# Patient Record
Sex: Female | Born: 1975 | Hispanic: Yes | Marital: Single | Smoking: Former smoker
Health system: Southern US, Community
[De-identification: ages and names within clinical notes are randomized; demographics above are authoritative.]

## PROBLEM LIST (undated history)

## (undated) DIAGNOSIS — F419 Anxiety disorder, unspecified: Secondary | ICD-10-CM

## (undated) DIAGNOSIS — F32A Depression, unspecified: Secondary | ICD-10-CM

## (undated) DIAGNOSIS — N939 Abnormal uterine and vaginal bleeding, unspecified: Secondary | ICD-10-CM

## (undated) DIAGNOSIS — D500 Iron deficiency anemia secondary to blood loss (chronic): Secondary | ICD-10-CM

## (undated) DIAGNOSIS — Z789 Other specified health status: Secondary | ICD-10-CM

## (undated) DIAGNOSIS — B1920 Unspecified viral hepatitis C without hepatic coma: Secondary | ICD-10-CM

## (undated) DIAGNOSIS — N83201 Unspecified ovarian cyst, right side: Secondary | ICD-10-CM

## (undated) DIAGNOSIS — D649 Anemia, unspecified: Secondary | ICD-10-CM

## (undated) DIAGNOSIS — K760 Fatty (change of) liver, not elsewhere classified: Secondary | ICD-10-CM

## (undated) HISTORY — PX: CHOLECYSTECTOMY, LAPAROSCOPIC: SHX56

## (undated) HISTORY — DX: Anemia, unspecified: D64.9

## (undated) HISTORY — DX: Anxiety disorder, unspecified: F41.9

## (undated) HISTORY — PX: LAPAROSCOPIC GASTRIC SLEEVE RESECTION: SHX5895

## (undated) HISTORY — DX: Depression, unspecified: F32.A

## (undated) HISTORY — DX: Unspecified viral hepatitis C without hepatic coma: B19.20

## (undated) HISTORY — PX: LAPAROSCOPIC OVARIAN CYSTECTOMY: SUR786

---

## 1996-12-17 HISTORY — PX: CHOLECYSTECTOMY, LAPAROSCOPIC: SHX56

## 2001-12-17 HISTORY — PX: LAPAROSCOPIC OVARIAN CYSTECTOMY: SUR786

## 2014-12-17 HISTORY — PX: LAPAROSCOPIC GASTRIC SLEEVE RESECTION: SHX5895

## 2016-01-18 ENCOUNTER — Other Ambulatory Visit: Payer: Self-pay | Admitting: Obstetrics and Gynecology

## 2016-03-01 ENCOUNTER — Other Ambulatory Visit (HOSPITAL_COMMUNITY): Payer: Self-pay | Admitting: Obstetrics and Gynecology

## 2016-03-01 ENCOUNTER — Encounter (HOSPITAL_COMMUNITY): Payer: Self-pay | Admitting: Obstetrics and Gynecology

## 2016-03-01 DIAGNOSIS — O09522 Supervision of elderly multigravida, second trimester: Secondary | ICD-10-CM

## 2016-03-01 DIAGNOSIS — Z3689 Encounter for other specified antenatal screening: Secondary | ICD-10-CM

## 2016-03-01 DIAGNOSIS — Z3A23 23 weeks gestation of pregnancy: Secondary | ICD-10-CM

## 2016-03-01 DIAGNOSIS — O283 Abnormal ultrasonic finding on antenatal screening of mother: Secondary | ICD-10-CM

## 2016-03-08 ENCOUNTER — Ambulatory Visit (HOSPITAL_COMMUNITY)
Admission: RE | Admit: 2016-03-08 | Discharge: 2016-03-08 | Disposition: A | Discharge status: Home / Self Care | Payer: Medicaid Other | Source: Ambulatory Visit | Attending: Obstetrics and Gynecology | Admitting: Obstetrics and Gynecology

## 2016-03-08 ENCOUNTER — Encounter (HOSPITAL_COMMUNITY): Payer: Self-pay

## 2016-03-08 DIAGNOSIS — Z3689 Encounter for other specified antenatal screening: Secondary | ICD-10-CM

## 2016-03-08 DIAGNOSIS — O09522 Supervision of elderly multigravida, second trimester: Secondary | ICD-10-CM | POA: Insufficient documentation

## 2016-03-08 DIAGNOSIS — Z3A23 23 weeks gestation of pregnancy: Secondary | ICD-10-CM | POA: Insufficient documentation

## 2016-03-08 DIAGNOSIS — Z315 Encounter for genetic counseling: Secondary | ICD-10-CM | POA: Insufficient documentation

## 2016-03-08 DIAGNOSIS — O283 Abnormal ultrasonic finding on antenatal screening of mother: Secondary | ICD-10-CM

## 2016-03-08 DIAGNOSIS — O09529 Supervision of elderly multigravida, unspecified trimester: Secondary | ICD-10-CM

## 2016-03-08 HISTORY — DX: Other specified health status: Z78.9

## 2016-03-09 ENCOUNTER — Other Ambulatory Visit (HOSPITAL_COMMUNITY): Payer: Self-pay | Admitting: *Deleted

## 2016-03-09 DIAGNOSIS — O09529 Supervision of elderly multigravida, unspecified trimester: Secondary | ICD-10-CM | POA: Insufficient documentation

## 2016-03-09 DIAGNOSIS — O35EXX0 Maternal care for other (suspected) fetal abnormality and damage, fetal genitourinary anomalies, not applicable or unspecified: Secondary | ICD-10-CM

## 2016-03-09 DIAGNOSIS — O358XX0 Maternal care for other (suspected) fetal abnormality and damage, not applicable or unspecified: Secondary | ICD-10-CM

## 2016-03-09 NOTE — Progress Notes (Signed)
Genetic Counseling  High-Risk Gestation Note  Appointment Date:  03/08/2016 Referred By: Hassell Done, MD Date of Birth:  [birth year]   [name] History: [id number] Estimated Date of Delivery: 07/03/16 Estimated Gestational Age: [redacted]w[redacted]d Attending: Particia Nearing, MD   [name] and her daughter were seen for genetic counseling because of a maternal age of 40 y.o. and abnormal ultrasound findings.   In Summary:   Detailed ultrasound performed today: Right fetal kidney was not visualized, left fetal kidney visualized. 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 Ultrasound performed at this time visualized normal left fetal kidney. Right fetal kidney could not be visualized at this time. Complete ultrasound results reported separately.   We discussed the possibility of ultrasound findings fitting with unilateral renal agenesis, which is typically sporadic. However, there are familial cases reported that are consistent with autosomal dominant inheritance. [name] reported no known family history of congenital kidney abnormalities for herself. She did not have family history or medical history information for the father of the [name]. Additionally, prenatal exposures such as warfarin, cocaine, and maternal diabetes have been reported to be associated with renal agenesis. [name] reported no known exposures during [name] that would be  expected to increase the chance for birth defects.  We also discussed that renal agenesis is described as an underlying feature of many single gene conditions and can also be seen with chromosome conditions. Specifically, renal agenesis has been described as a feature of two birth defects associations: VACTERL and MURCS, but no additional features suggestive of these associations were reportedly observed on the patient's previous ultrasound. However, she was counseled that there were no other features reportedly observed by ultrasound that were strongly suggestive of a specific single gene condition or additional features suggestive of a chromosome condition at this time.  We discussed gene, chromosomes, and the chance for nondisjunction associated with age of the ova. She was counseled regarding maternal age and the association with risk for chromosome conditions due to nondisjunction with aging of the ova.   We reviewed chromosomes, nondisjunction, and the associated 1 in 83 risk for fetal aneuploidy related to a maternal age of 40 y.o. at [redacted]w[redacted]d gestation.  She was counseled that the risk for aneuploidy decreases as gestational age increases, accounting for those pregnancies which spontaneously abort.  We specifically discussed Down syndrome (trisomy 43), trisomies 77 and 45, and sex chromosome aneuploidies (47,XXX and 47,XXY) including the common features and prognoses of each.  [name] previously had Quad screening, which was within normal range. We reviewed that this screen modifies the a priori risk to provide a [name] specific risk assessment but is not diagnostic for these conditions. We reviewed the reduction in risks for fetal Down syndrome (1 in 711), open spina bifida (1 in 10,000), and trisomy 18. We reviewed additional available screening and diagnostic option for chromosome conditions.  Regarding screening tests, we discussed the option of noninvasive prenatal screening (NIPS)/cell  free fetal DNA testing.  She understands that screening tests are used to modify a  patient's a priori risk for aneuploidy, typically based on age.  This estimate provides a [name] specific risk assessment. We reviewed the risks, benefits, and limitations of NIPS and reviewed the conditions screened, detection rates, and false positive rates for each. We also reviewed the availability of diagnostic option of amniocentesis. We discussed that associated 1 in 300-500 risk for complications with amniocentesis, including spontaneous [name] loss.  After careful consideration of these options, [name] elected to proceed with NIPS [facility] and declined amniocentesis at this time.  She understands that ultrasound and NIPS cannot diagnose or rule out all birth defects or genetic syndromes.   We discussed that in the case of isolated renal agenesis, recurrence risk is likely low, given that sporadic occurrence is observed in the majority of cases. However, given that autosomal dominant inheritance has been observed in families, renal imaging would be available to [name] in order to more accurately determine recurrence risk assessment for relatives. In the case of an underlying autosomal dominant trait, recurrence risk would be 1 in 2 (50%). In the less likely case of an underlying genetic syndrome as the cause, recurrence risk would depend upon the inheritance of the particular condition.  We discussed the option of a prenatal consultation with pediatric urology, and [name] is interested in this referral. We will facilitate this for the patient. Follow-up ultrasound is scheduled for 04/19/16.   [name]'s family history was reviewed and found to be noncontributory for birth defects, intellectual disability, and known genetic conditions. [name] was not familiar with the father of the baby's family  history.  We, therefore, cannot comment on how his history might contribute to the overall chance for birth defects or genetic conditions.   Without further information regarding the provided family history, an accurate genetic risk cannot be calculated. Further genetic counseling is warranted if more information is obtained.  [name] denied exposure to environmental toxins or chemical agents. She denied the use of alcohol, tobacco or street drugs. She denied significant viral illnesses during the course of her [name]. Her medical and surgical histories were noncontributory.   I counseled Ms. Heidi FusiMaria Lepage regarding the above risks and available options.  The approximate face-to-face time with the genetic counselor was 35 minutes.  Heidi PlowmanKaren Britteney Ayotte, MS,  Certified Genetic Counselor 03/09/2016

## 2016-03-15 ENCOUNTER — Other Ambulatory Visit (HOSPITAL_COMMUNITY): Payer: Self-pay

## 2016-03-15 ENCOUNTER — Telehealth (HOSPITAL_COMMUNITY): Payer: Self-pay | Admitting: MS"

## 2016-03-15 NOTE — Telephone Encounter (Signed)
Called Heidi [name] to discuss her prenatal cell free DNA test results.  [name] [facility].  Testing was offered because of ultrasound finding and maternal age.   The patient was identified by name and DOB.  We reviewed that these are within normal limits, showing a less than 1 in 10,000 risk for trisomies 21, 18 and 13, and monosomy X (Turner syndrome).  In addition, the risk for triploidy and sex chromosome trisomies (47,XXX and 47,XXY) was also low risk.  We reviewed that this testing identifies > 99% of pregnancies with trisomy 1421, trisomy 2813, sex chromosome trisomies (47,XXX and 47,XXY), and triploidy. The detection rate for trisomy 18 is 96%.  The detection rate for monosomy X is ~92%.  The false positive rate is <0.1% for all conditions. Testing was also consistent with female fetal sex.  She understands that this testing does not identify all genetic conditions.  All questions were answered to her satisfaction, she was encouraged to call with additional questions or concerns.  Quinn PlowmanKaren Darivs Lunden, MS Certified Genetic Counselor 03/15/2016 2:28 PM

## 2016-03-15 NOTE — Telephone Encounter (Signed)
Attempted to contact patient regarding prenatal cell free DNA testing results, which are within normal range. Left message for patient to return call.   Clydie BraunKaren Kashmir Lysaght  03/15/2016 1:29 PM

## 2016-04-19 ENCOUNTER — Ambulatory Visit (HOSPITAL_COMMUNITY): Payer: Medicaid Other

## 2016-04-25 ENCOUNTER — Ambulatory Visit (HOSPITAL_COMMUNITY)
Admission: RE | Admit: 2016-04-25 | Discharge: 2016-04-25 | Disposition: A | Discharge status: Home / Self Care | Payer: Medicaid Other | Source: Ambulatory Visit | Attending: Obstetrics and Gynecology | Admitting: Obstetrics and Gynecology

## 2016-04-25 ENCOUNTER — Other Ambulatory Visit (HOSPITAL_COMMUNITY): Payer: Self-pay | Admitting: Maternal and Fetal Medicine

## 2016-04-25 ENCOUNTER — Encounter (HOSPITAL_COMMUNITY): Payer: Self-pay

## 2016-04-25 DIAGNOSIS — Z3A30 30 weeks gestation of pregnancy: Secondary | ICD-10-CM | POA: Diagnosis not present

## 2016-04-25 DIAGNOSIS — O99213 Obesity complicating pregnancy, third trimester: Secondary | ICD-10-CM

## 2016-04-25 DIAGNOSIS — O358XX0 Maternal care for other (suspected) fetal abnormality and damage, not applicable or unspecified: Secondary | ICD-10-CM

## 2016-04-25 DIAGNOSIS — O35EXX0 Maternal care for other (suspected) fetal abnormality and damage, fetal genitourinary anomalies, not applicable or unspecified: Secondary | ICD-10-CM

## 2016-04-25 DIAGNOSIS — O09523 Supervision of elderly multigravida, third trimester: Secondary | ICD-10-CM | POA: Diagnosis not present

## 2016-04-25 DIAGNOSIS — E669 Obesity, unspecified: Secondary | ICD-10-CM | POA: Diagnosis not present

## 2016-04-26 ENCOUNTER — Other Ambulatory Visit (HOSPITAL_COMMUNITY): Payer: Self-pay | Admitting: *Deleted

## 2016-04-26 DIAGNOSIS — O35EXX0 Maternal care for other (suspected) fetal abnormality and damage, fetal genitourinary anomalies, not applicable or unspecified: Secondary | ICD-10-CM

## 2016-04-26 DIAGNOSIS — O358XX0 Maternal care for other (suspected) fetal abnormality and damage, not applicable or unspecified: Secondary | ICD-10-CM

## 2016-05-10 ENCOUNTER — Encounter (HOSPITAL_COMMUNITY): Payer: Self-pay

## 2016-05-10 ENCOUNTER — Other Ambulatory Visit (HOSPITAL_COMMUNITY): Payer: Self-pay | Admitting: Maternal and Fetal Medicine

## 2016-05-10 ENCOUNTER — Ambulatory Visit (HOSPITAL_COMMUNITY)
Admission: RE | Admit: 2016-05-10 | Discharge: 2016-05-10 | Disposition: A | Discharge status: Home / Self Care | Payer: Medicaid Other | Source: Ambulatory Visit | Attending: Obstetrics and Gynecology | Admitting: Obstetrics and Gynecology

## 2016-05-10 DIAGNOSIS — E669 Obesity, unspecified: Secondary | ICD-10-CM | POA: Insufficient documentation

## 2016-05-10 DIAGNOSIS — Z3A32 32 weeks gestation of pregnancy: Secondary | ICD-10-CM | POA: Insufficient documentation

## 2016-05-10 DIAGNOSIS — O35EXX0 Maternal care for other (suspected) fetal abnormality and damage, fetal genitourinary anomalies, not applicable or unspecified: Secondary | ICD-10-CM

## 2016-05-10 DIAGNOSIS — O358XX0 Maternal care for other (suspected) fetal abnormality and damage, not applicable or unspecified: Secondary | ICD-10-CM | POA: Diagnosis present

## 2016-05-10 DIAGNOSIS — O09523 Supervision of elderly multigravida, third trimester: Secondary | ICD-10-CM

## 2016-05-10 DIAGNOSIS — Z36 Encounter for antenatal screening of mother: Secondary | ICD-10-CM | POA: Diagnosis not present

## 2016-05-10 DIAGNOSIS — IMO0002 Reserved for concepts with insufficient information to code with codable children: Secondary | ICD-10-CM

## 2016-05-10 DIAGNOSIS — O359XX0 Maternal care for (suspected) fetal abnormality and damage, unspecified, not applicable or unspecified: Secondary | ICD-10-CM

## 2016-05-10 DIAGNOSIS — Z0489 Encounter for examination and observation for other specified reasons: Secondary | ICD-10-CM

## 2016-05-10 DIAGNOSIS — O99213 Obesity complicating pregnancy, third trimester: Secondary | ICD-10-CM | POA: Insufficient documentation

## 2016-05-15 ENCOUNTER — Ambulatory Visit (HOSPITAL_COMMUNITY): Payer: Medicaid Other

## 2016-05-16 ENCOUNTER — Ambulatory Visit (HOSPITAL_COMMUNITY): Payer: Medicaid Other

## 2016-06-08 ENCOUNTER — Encounter (HOSPITAL_COMMUNITY): Payer: Self-pay

## 2016-06-08 ENCOUNTER — Ambulatory Visit (HOSPITAL_COMMUNITY)
Admission: RE | Admit: 2016-06-08 | Discharge: 2016-06-08 | Disposition: A | Discharge status: Home / Self Care | Payer: Medicaid Other | Source: Ambulatory Visit | Attending: Obstetrics and Gynecology | Admitting: Obstetrics and Gynecology

## 2016-06-08 ENCOUNTER — Other Ambulatory Visit (HOSPITAL_COMMUNITY): Payer: Self-pay | Admitting: Maternal and Fetal Medicine

## 2016-06-08 VITALS — BP 100/72 | HR 65 | Wt 178.2 lb

## 2016-06-08 DIAGNOSIS — E669 Obesity, unspecified: Secondary | ICD-10-CM | POA: Insufficient documentation

## 2016-06-08 DIAGNOSIS — O99213 Obesity complicating pregnancy, third trimester: Secondary | ICD-10-CM | POA: Insufficient documentation

## 2016-06-08 DIAGNOSIS — Z3A36 36 weeks gestation of pregnancy: Secondary | ICD-10-CM

## 2016-06-08 DIAGNOSIS — O09529 Supervision of elderly multigravida, unspecified trimester: Secondary | ICD-10-CM

## 2016-06-08 DIAGNOSIS — Q639 Congenital malformation of kidney, unspecified: Secondary | ICD-10-CM | POA: Insufficient documentation

## 2016-06-08 DIAGNOSIS — O09523 Supervision of elderly multigravida, third trimester: Secondary | ICD-10-CM | POA: Diagnosis present

## 2016-06-08 DIAGNOSIS — O358XX0 Maternal care for other (suspected) fetal abnormality and damage, not applicable or unspecified: Secondary | ICD-10-CM | POA: Insufficient documentation

## 2016-06-08 DIAGNOSIS — O35EXX0 Maternal care for other (suspected) fetal abnormality and damage, fetal genitourinary anomalies, not applicable or unspecified: Secondary | ICD-10-CM

## 2017-01-11 ENCOUNTER — Encounter (HOSPITAL_COMMUNITY): Payer: Self-pay

## 2017-10-02 IMAGING — US US MFM FETAL BPP W/O NON-STRESS
1 series · 13 of 28 positions shown · non-contrast
Comparison: none

[Series 1: us mfm fetal bpp w/o non-stress · 50 acquisitions, 13 frames shown]
[im 2/50]
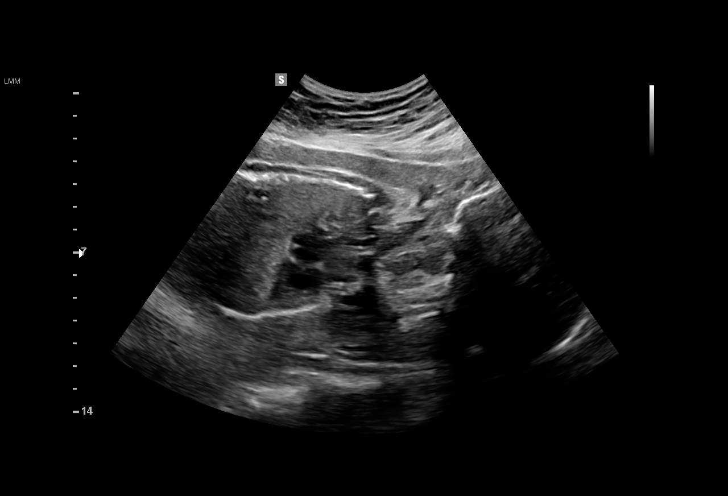
[im 6/50]
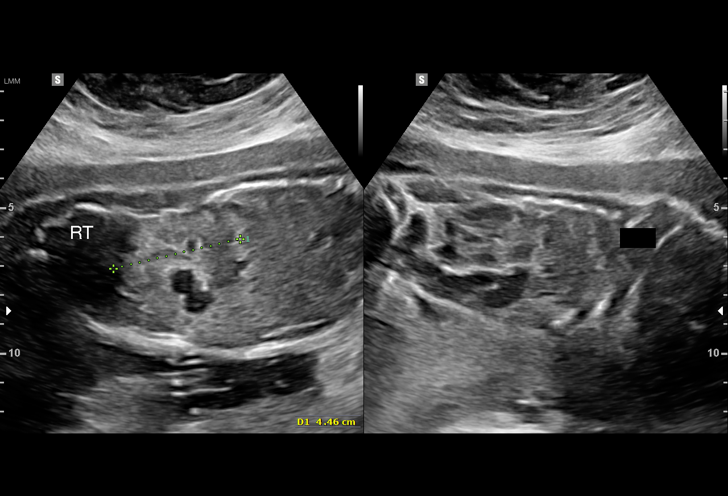
[im 10/50]
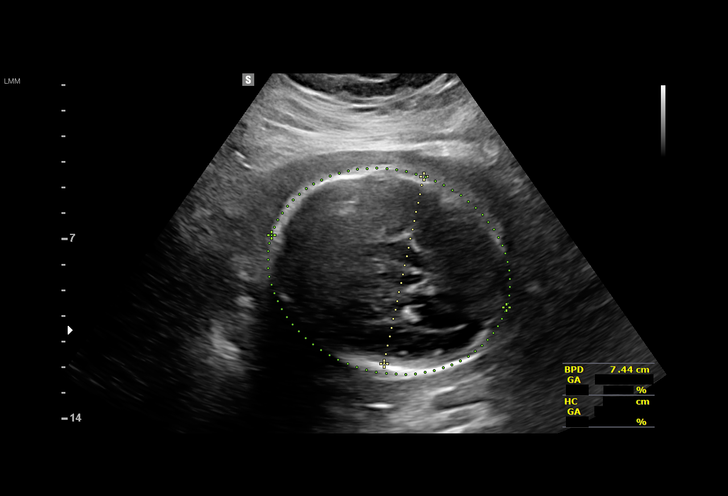
[im 13/50]
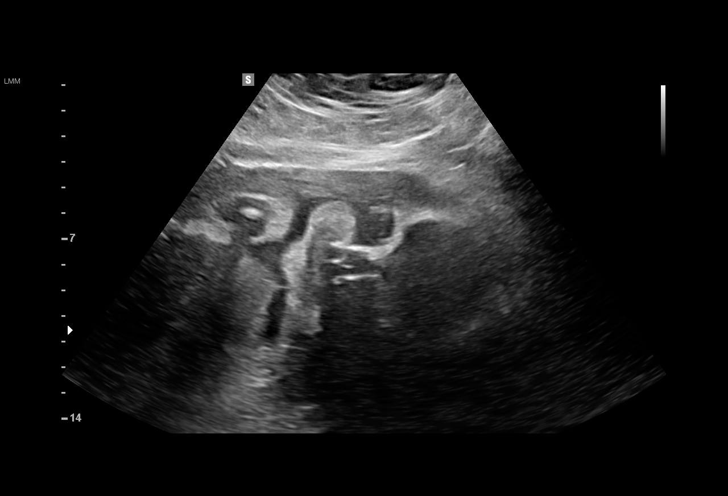
[im 17/50]
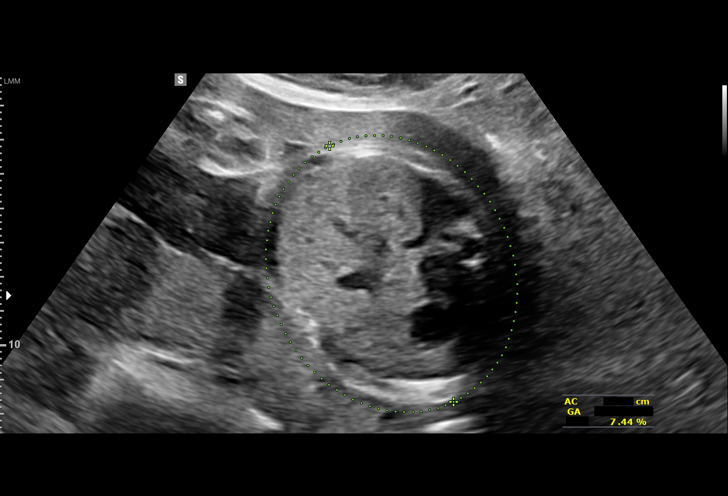
[im 20/50]
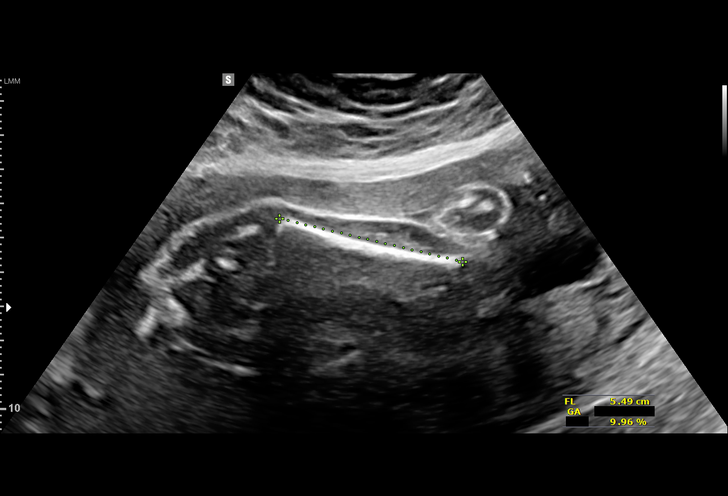
[im 26/50]
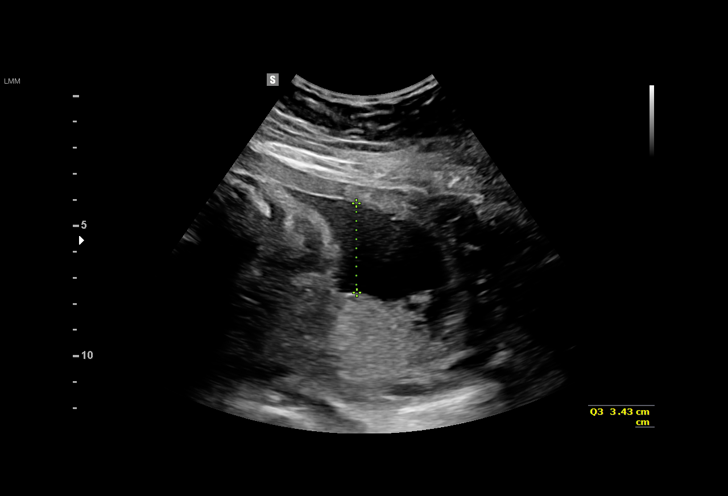
[im 30/50]
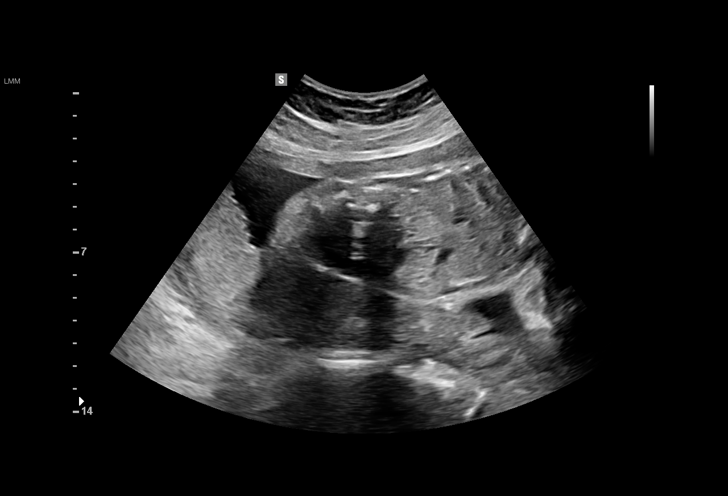
[im 33/50]
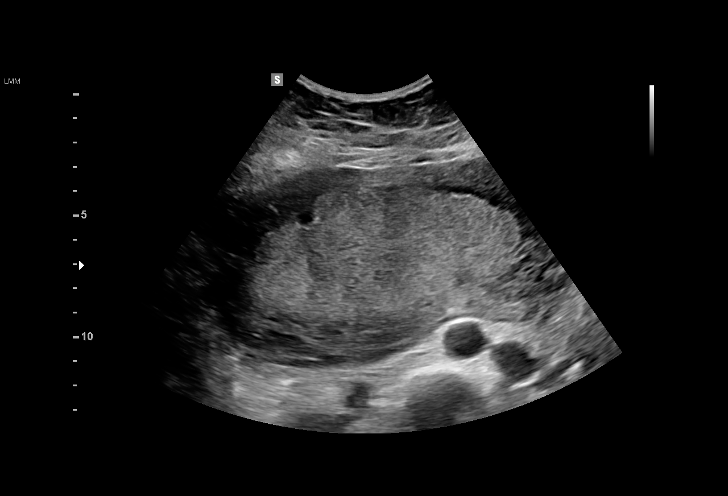
[im 37/50]
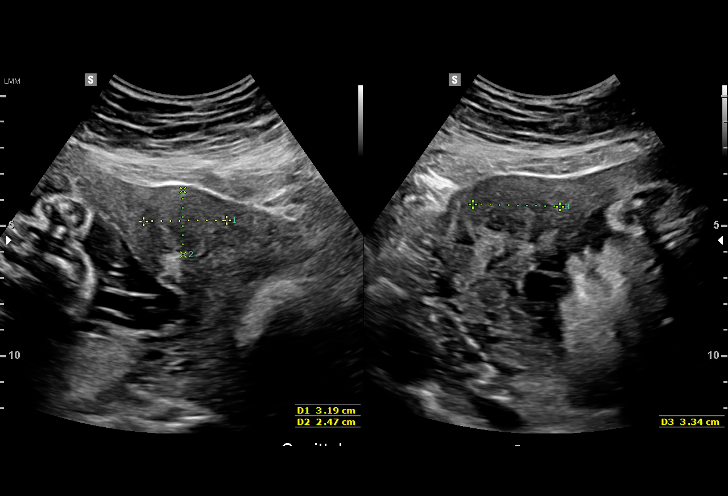
[im 40/50]
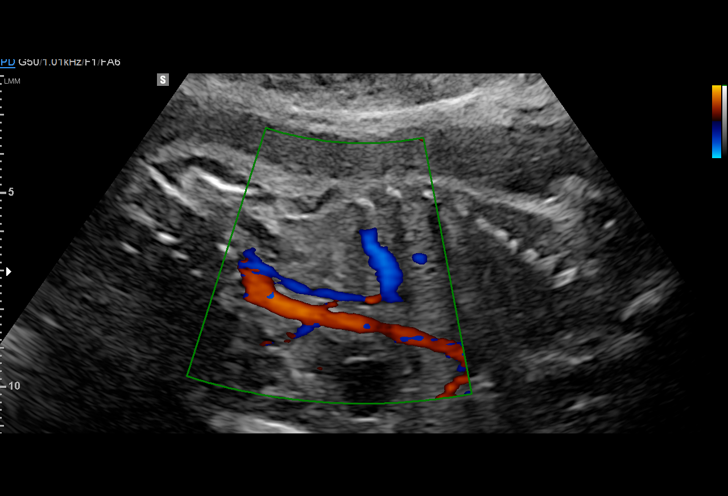
[im 44/50]
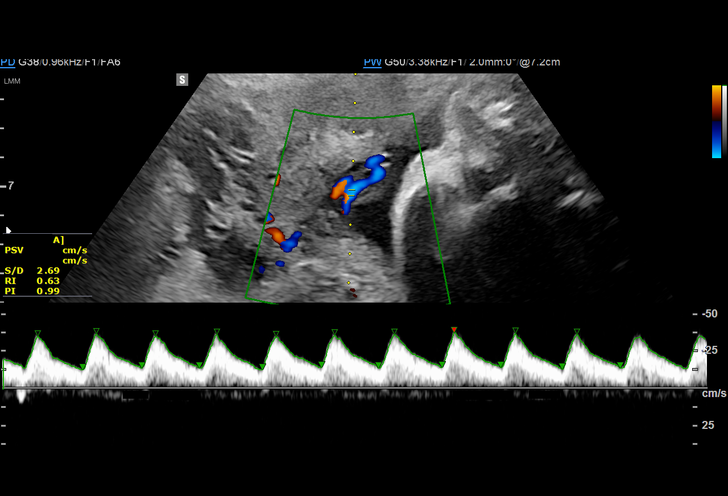
[im 48/50]
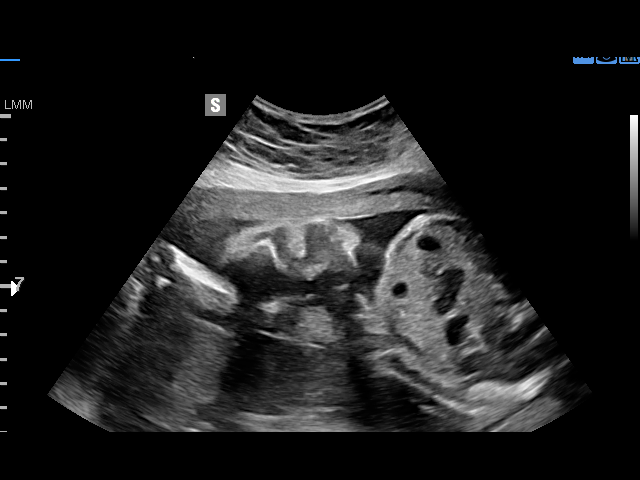

[13 of 28 positions shown; findings below may reference images not displayed]

1  LISUARTE KINGWELL            [id number]      [id number]     [id number]
2  LISUARTE KINGWELL            [id number]      [id number]     [id number]
3  LISUARTE KINGWELL            [id number]      [id number]     [id number]
Indications

30 weeks gestation of pregnancy
Advanced maternal age multigravida 35+,
third trimester
Obesity complicating pregnancy, third
trimester
Fetal abnormality - other known or
suspected: abnormal right kidney
OB History

Blood Type:            Height:         Weight (lb):  170      BMI:
Gravidity:    2         Term:   1        Prem:   0        SAB:   0
TOP:          0       Ectopic:  0        Living: 1
Fetal Evaluation

Num Of Fetuses:     1
Fetal Heart         154
Rate(bpm):
Cardiac Activity:   Observed
Presentation:       Cephalic
Placenta:           Posterior, above cervical os
P. Cord Insertion:  Previously Visualized

Amniotic Fluid
AFI FV:      Subjectively within normal limits

AFI Sum(cm)     %Tile       Largest Pocket(cm)
12.56           34
RUQ(cm)       RLQ(cm)       LUQ(cm)        LLQ(cm)
2.54
Biophysical Evaluation

Amniotic F.V:   Pocket => 2 cm two         F. Tone:        Observed
planes
F. Movement:    Observed                   Score:          [DATE]
F. Breathing:   Observed
Biometry

BPD:      73.9  mm     G. Age:  29w 5d         23  %    CI:        71.45   %   70 - 86
FL/HC:      19.4   %   19.2 -
HC:      278.4  mm     G. Age:  30w 3d         24  %    HC/AC:      1.14       0.99 -
AC:      244.5  mm     G. Age:  28w 5d         10  %    FL/BPD:     73.2   %   71 - 87
FL:       54.1  mm     G. Age:  28w 4d          7  %    FL/AC:      22.1   %   20 - 24
HUM:      46.3  mm     G. Age:  27w 2d        < 5  %

Est. FW:    0475  gm    2 lb 14 oz      28  %
Gestational Age

LMP:           33w 0d       Date:   09/07/15                 EDD:   06/13/16
U/S Today:     29w 3d                                        EDD:   07/08/16
Best:          30w 1d    Det. By:   Early Ultrasound         EDD:   07/03/16
(01/16/16)
Anatomy

Cranium:               Appears normal         Aortic Arch:            Previously seen
Cavum:                 Appears normal         Ductal Arch:            Not well visualized
Ventricles:            Previously seen        Diaphragm:              Previously seen
Choroid Plexus:        Previously seen        Stomach:                Appears normal, left
sided
Cerebellum:            Previously seen        Abdomen:                Appears normal
Posterior Fossa:       Previously seen        Abdominal Wall:         Previously seen
Nuchal Fold:           Not applicable (>20    Cord Vessels:           Previously seen
wks GA)
Face:                  Profile appears        Kidneys:                Abnormal, see
normal
comments
Lips:                  Previously seen        Bladder:                Appears normal
Thoracic:              Appears normal         Spine:                  Previously seen
Heart:                 Previously seen        Upper Extremities:      Previously seen
RVOT:                  Previously seen        Lower Extremities:      Previously seen
LVOT:                  Previously seen

Other:  Heels and 5th digit previously seen.
Doppler - Fetal Vessels

Umbilical Artery
S/D     %tile     RI              PI              PSV
(cm/s)
2.6       37

Cervix Uterus Adnexa
Cervix
Not visualized (advanced GA >63wks)

Uterus
Single fibroid noted, see table below.

Left Ovary
Not visualized.

Right Ovary
Not visualized.
Myomas

Site                     L(cm)      W(cm)      D(cm)      Location
LUS Rt anterolateral     3.2        3.3        2.5        Intramural

Blood Flow                 RI        PI       Comments

Impression

Single IUP at 30w 1d
Right renal abnormality is again noted - right renal agenesis
vs. small atrophied right kidney
The left kidney appears normal
The overall fetal growth is appropriate (28th %tile).  The AC
measures at the 10th %tile.
BPP [DATE]
UA Doppler studies: Normal for gestational age
Normal amniotic fluid volume

Patient was seen by Peds Urology - plan to reasses after
delivery
Recommendations

Recommend follow-up ultrasound examination in 3 weeks for
interval growth

## 2017-10-17 IMAGING — US US MFM OB FOLLOW-UP
1 series · 14 of 28 positions shown · non-contrast
Comparison: none

[Series 1: us mfm ob follow-up · 82 acquisitions, 14 frames shown]
[im 4/82]
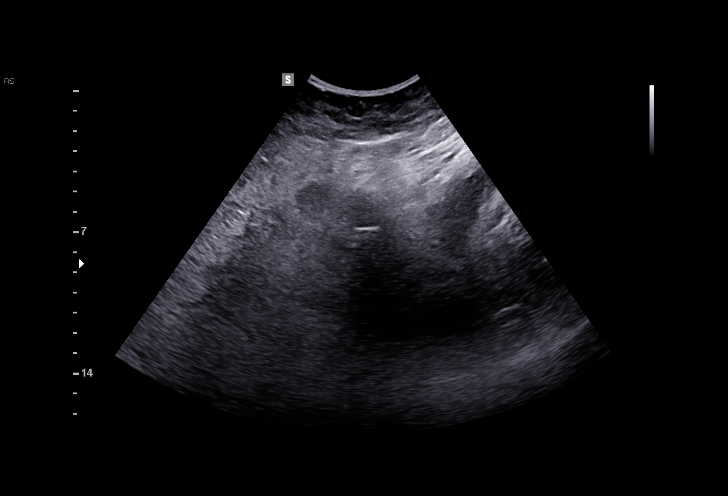
[im 10/82]
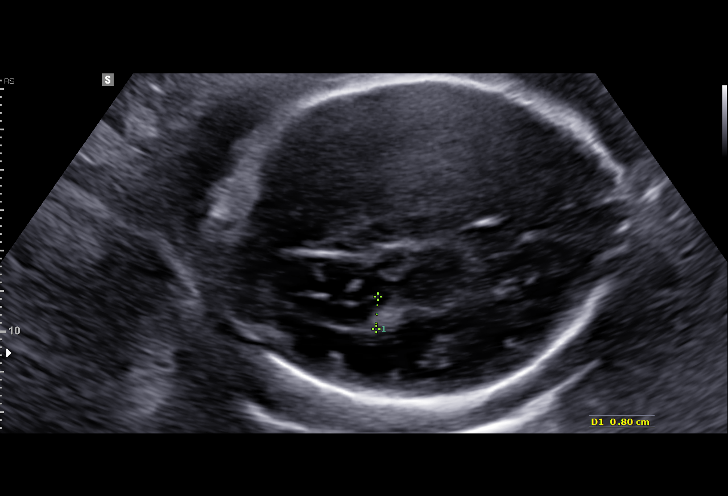
[im 16/82]
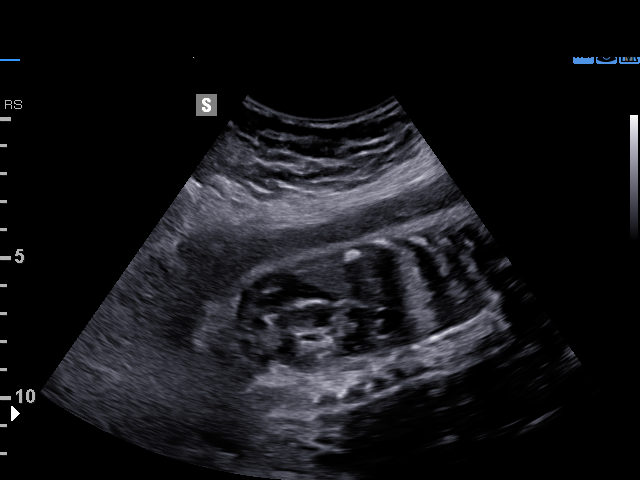
[im 22/82]
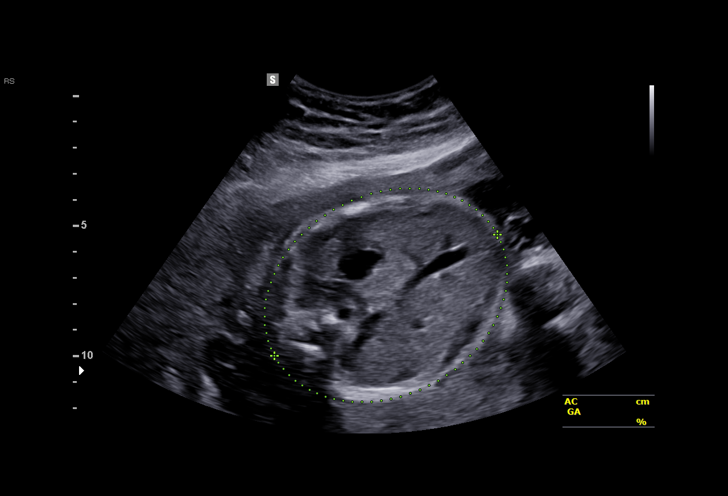
[im 28/82]
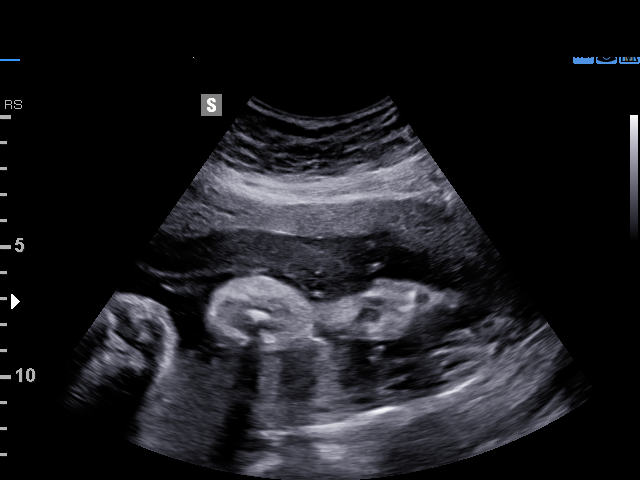
[im 34/82]
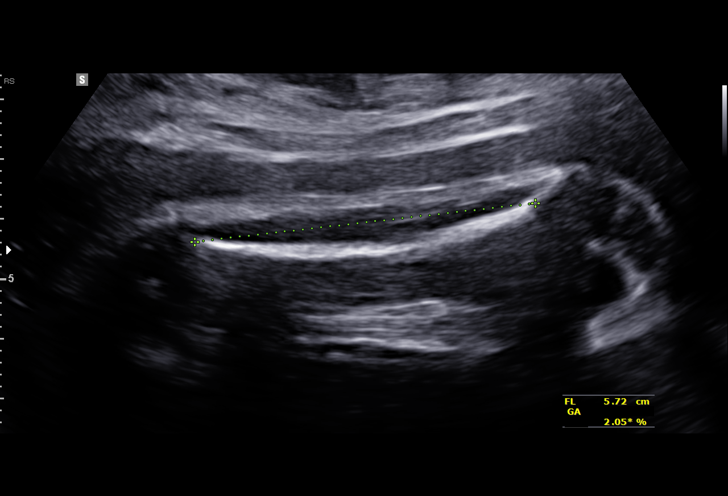
[im 40/82]
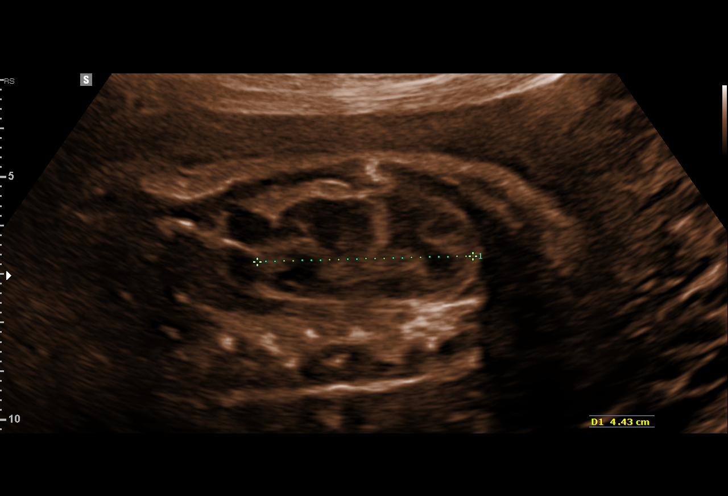
[im 46/82]
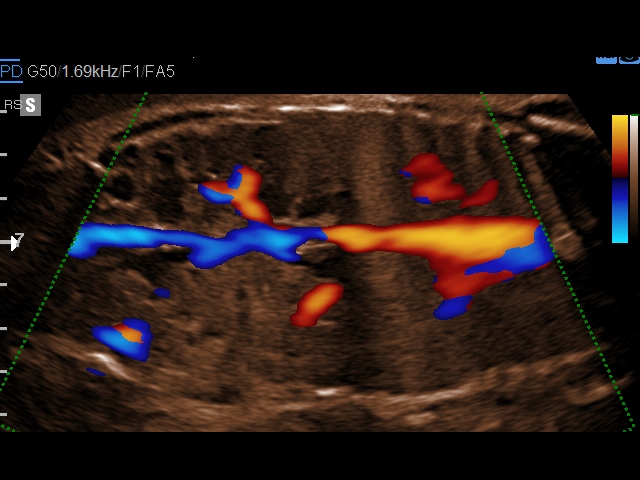
[im 52/82]
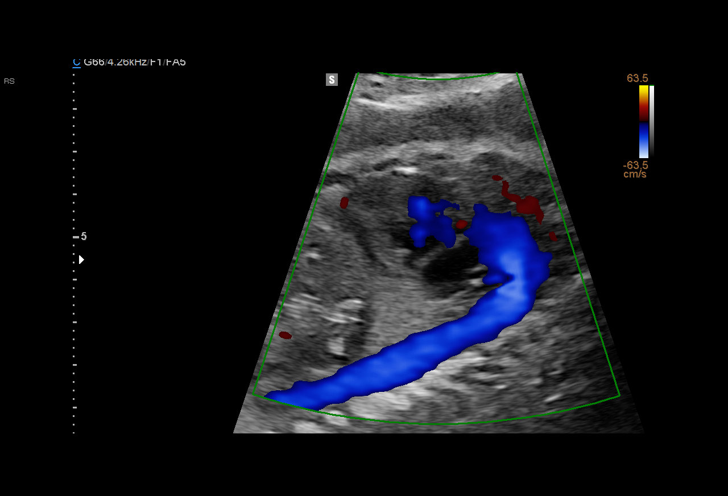
[im 58/82]
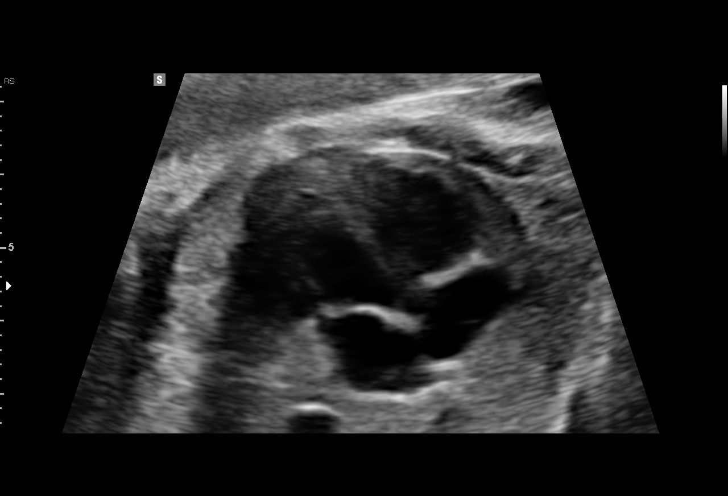
[im 64/82]
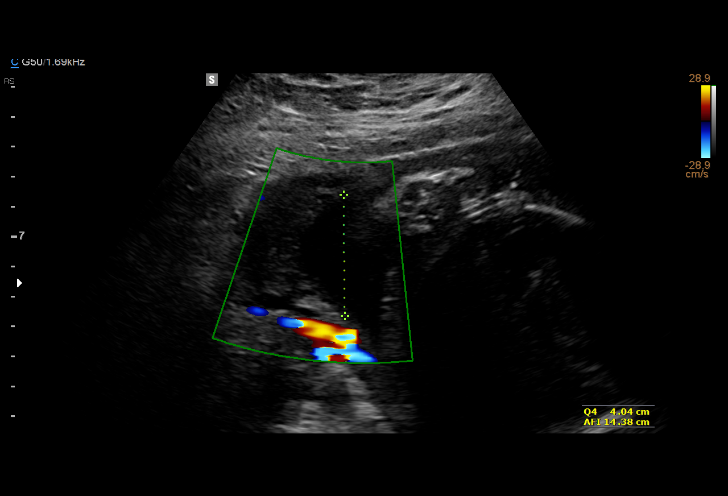
[im 70/82]
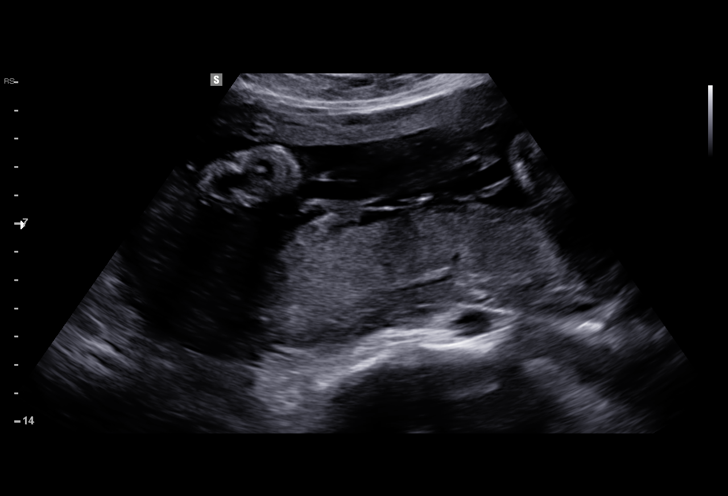
[im 76/82]
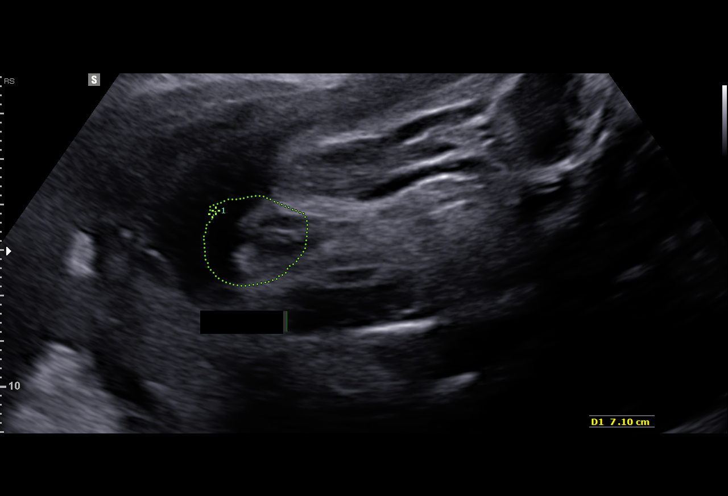
[im 82/82]
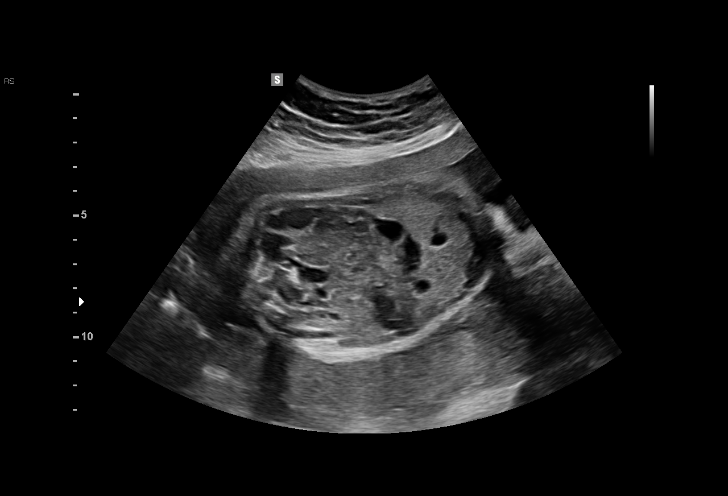

[14 of 28 positions shown; findings below may reference images not displayed]

1  JANDO FERN            [id number]      [id number]     [id number]
Indications

32 weeks gestation of pregnancy
Advanced maternal age multigravida 35+,
third trimester
Obesity complicating pregnancy, third
trimester
Fetal abnormality - other known or
suspected: abnormal right kidney
OB History

Blood Type:            Height:         Weight (lb):  170      BMI:
Gravidity:    2         Term:   1        Prem:   0        SAB:   0
TOP:          0       Ectopic:  0        Living: 1
Fetal Evaluation

Num Of Fetuses:     1
Fetal Heart         142
Rate(bpm):
Cardiac Activity:   Observed
Presentation:       Cephalic
Placenta:           Posterior, above cervical os
P. Cord Insertion:  Previously Visualized

Amniotic Fluid
AFI FV:      Subjectively within normal limits

AFI Sum(cm)     %Tile       Largest Pocket(cm)
14.38           50

RUQ(cm)       RLQ(cm)       LUQ(cm)        LLQ(cm)
3.85
Biometry

BPD:      79.1  mm     G. Age:  31w 5d         26  %    CI:        73.87   %   70 - 86
FL/HC:      19.5   %   19.1 -
HC:      292.3  mm     G. Age:  32w 2d         15  %    HC/AC:      1.06       0.96 -
AC:      274.9  mm     G. Age:  31w 4d         28  %    FL/BPD:     72.2   %   71 - 87
FL:       57.1  mm     G. Age:  30w 0d        < 3  %    FL/AC:      20.8   %   20 - 24
HUM:      51.8  mm     G. Age:  30w 2d         11  %

Est. FW:    0195  gm    3 lb 12 oz      34  %
Gestational Age

LMP:           35w 1d       Date:   09/07/15                 EDD:   06/13/16
U/S Today:     31w 3d                                        EDD:   07/09/16
Best:          32w 2d    Det. By:   Early Ultrasound         EDD:   07/03/16
(01/16/16)
Anatomy

Cranium:               Previously seen        Aortic Arch:            Previously seen
Cavum:                 Previously seen        Ductal Arch:            Appears normal
Ventricles:            Appears normal         Diaphragm:              Previously seen
Choroid Plexus:        Previously seen        Stomach:                Appears normal, left
sided
Cerebellum:            Previously seen        Abdomen:                Previously seen
Posterior Fossa:       Previously seen        Abdominal Wall:         Previously seen
Nuchal Fold:           Not applicable (>20    Cord Vessels:           Previously seen
wks GA)
Face:                  Profile previously     Kidneys:                Abnormal, see
seen
comments
Lips:                  Previously seen        Bladder:                Appears normal
Thoracic:              Appears normal         Spine:                  Previously seen
Heart:                 Previously seen        Upper Extremities:      Previously seen
RVOT:                  Appears normal         Lower Extremities:      Previously seen
LVOT:                  Appears normal

Other:  Heels and 5th digit previously seen. Fetus appears to be a female.
Technically difficult due to maternal habitus and fetal position.
Cervix Uterus Adnexa

Cervix
Not visualized (advanced GA >78wks)

Uterus
Single fibroid noted, see table below.

Left Ovary
Not visualized.

Right Ovary
Not visualized.

Adnexa:       No abnormality visualized. No adnexal mass
visualized.
Myomas

Site                     L(cm)      W(cm)      D(cm)      Location
Anterior
Blood Flow                 RI        PI       Comments

Impression

Single IUP at 32w 2d
Right renal abnormality is again noted - right renal agenesis
vs. small atrophied right kidney
The left kidney appears normal
A single fluid filled loop of bowel is appreciated
The overall fetal growth is appropriate (34th %tile).  The AC
measures at the 28th %tile.
Anterior uterine myoma noted as described above
Normal amniotic fluid volume

Patient was seen by Peds Urology - plan to reasses after
delivery
Recommendations

Recommend follow-up ultrasound examination in 4 weeks
Will need imaging studies of the newborn kidneys after
delivery - notify Peds a time of delivery

## 2019-07-18 HISTORY — PX: LAPAROSCOPIC TUBAL LIGATION: SUR803

## 2022-10-30 LAB — HM MAMMOGRAPHY

## 2022-11-16 DIAGNOSIS — B182 Chronic viral hepatitis C: Secondary | ICD-10-CM

## 2022-11-16 HISTORY — DX: Chronic viral hepatitis C: B18.2

## 2022-11-21 ENCOUNTER — Other Ambulatory Visit: Payer: Self-pay | Admitting: Nurse Practitioner

## 2022-11-21 DIAGNOSIS — R748 Abnormal levels of other serum enzymes: Secondary | ICD-10-CM

## 2022-11-21 DIAGNOSIS — B182 Chronic viral hepatitis C: Secondary | ICD-10-CM

## 2022-11-29 ENCOUNTER — Inpatient Hospital Stay: Admission: RE | Admit: 2022-11-29 | Payer: Medicaid Other | Source: Ambulatory Visit

## 2022-12-19 ENCOUNTER — Ambulatory Visit
Admission: RE | Admit: 2022-12-19 | Discharge: 2022-12-19 | Disposition: A | Discharge status: Home / Self Care | Payer: Medicaid Other | Source: Ambulatory Visit | Attending: Nurse Practitioner | Admitting: Nurse Practitioner

## 2022-12-19 DIAGNOSIS — B182 Chronic viral hepatitis C: Secondary | ICD-10-CM

## 2022-12-19 DIAGNOSIS — R748 Abnormal levels of other serum enzymes: Secondary | ICD-10-CM

## 2023-05-23 ENCOUNTER — Encounter: Payer: Self-pay | Admitting: Hematology and Oncology

## 2023-05-23 ENCOUNTER — Other Ambulatory Visit: Payer: Self-pay | Admitting: Hematology and Oncology

## 2023-05-23 ENCOUNTER — Inpatient Hospital Stay (INDEPENDENT_AMBULATORY_CARE_PROVIDER_SITE_OTHER): Payer: Medicaid Other | Admitting: Hematology and Oncology

## 2023-05-23 ENCOUNTER — Inpatient Hospital Stay: Payer: Medicaid Other | Attending: Hematology and Oncology

## 2023-05-23 VITALS — BP 125/78 | HR 67 | Temp 98.4°F | Resp 20 | Ht 61.0 in | Wt 164.6 lb

## 2023-05-23 DIAGNOSIS — F419 Anxiety disorder, unspecified: Secondary | ICD-10-CM | POA: Insufficient documentation

## 2023-05-23 DIAGNOSIS — Z8619 Personal history of other infectious and parasitic diseases: Secondary | ICD-10-CM | POA: Insufficient documentation

## 2023-05-23 DIAGNOSIS — Z79899 Other long term (current) drug therapy: Secondary | ICD-10-CM | POA: Insufficient documentation

## 2023-05-23 DIAGNOSIS — D500 Iron deficiency anemia secondary to blood loss (chronic): Secondary | ICD-10-CM | POA: Diagnosis not present

## 2023-05-23 DIAGNOSIS — N920 Excessive and frequent menstruation with regular cycle: Secondary | ICD-10-CM

## 2023-05-23 DIAGNOSIS — F17210 Nicotine dependence, cigarettes, uncomplicated: Secondary | ICD-10-CM | POA: Insufficient documentation

## 2023-05-23 DIAGNOSIS — Z9884 Bariatric surgery status: Secondary | ICD-10-CM | POA: Insufficient documentation

## 2023-05-23 DIAGNOSIS — E559 Vitamin D deficiency, unspecified: Secondary | ICD-10-CM | POA: Diagnosis not present

## 2023-05-23 DIAGNOSIS — F32A Depression, unspecified: Secondary | ICD-10-CM | POA: Insufficient documentation

## 2023-05-23 DIAGNOSIS — D509 Iron deficiency anemia, unspecified: Secondary | ICD-10-CM | POA: Diagnosis present

## 2023-05-23 LAB — FERRITIN: Ferritin: 2 ng/mL — ABNORMAL LOW (ref 11–307)

## 2023-05-23 LAB — VITAMIN B12: Vitamin B-12: 357 pg/mL (ref 180–914)

## 2023-05-23 LAB — RETICULOCYTES
Immature Retic Fract: 11.9 % (ref 2.3–15.9)
RBC.: 3.8 MIL/uL — ABNORMAL LOW (ref 3.87–5.11)
Retic Count, Absolute: 52.4 10*3/uL (ref 19.0–186.0)
Retic Ct Pct: 1.4 % (ref 0.4–3.1)

## 2023-05-23 LAB — DIRECT ANTIGLOBULIN TEST (NOT AT ARMC)
DAT, IgG: NEGATIVE
DAT, complement: NEGATIVE

## 2023-05-23 LAB — LACTATE DEHYDROGENASE: LDH: 115 U/L (ref 98–192)

## 2023-05-23 LAB — HCG, SERUM, QUALITATIVE: Preg, Serum: NEGATIVE

## 2023-05-23 LAB — FOLATE: Folate: 4.8 ng/mL — ABNORMAL LOW (ref >5.9)

## 2023-05-23 NOTE — Progress Notes (Cosign Needed)
[facility] [address] [place],  [place]  [address] [phone]  Addendum: Folate returned low at 4.8.  The patient will be started on folic acid 1 mg daily.  Clinic Day:  05/23/2023  Referring physician: Maris Berger, MD   REASON FOR CONSULTATION:  Iron deficiency anemia  HISTORY OF PRESENT ILLNESS:  Heidi [name] is a 47 y.o. female with iron deficiency anemia who is referred in consultation by Dr. Maris Berger for assessment and management.  On May 6, BC revealed hemoglobin of 8.1 with an MCV of 69, white count of 8.8 with a normal differential and platelets 388,000.  Ferritin was 2.  vitamin D was 17.  Review of her medical records reveals iron deficiency dating back to at least October 2023.  The patient states that she has been on oral iron supplementation for about 2 years.  She has difficulty tolerating oral iron due to constipation.  She reports severe fatigue not relieved by rest.  She reports pica to ice.  She is status post gastric sleeve in 2016.  Age of menarche 47 years old.  She states she has had irregular menses, but has had prolonged heavy menses for about 7 years.  She has history of left ovarian cyst removal in the late 1990s.  Pelvic ultrasound in March revealed uterus measurements: 8.9 x 5.3 x 6.4 centimeters = volume: 156.2 mL. Uterus is retroflexed. 1.6 cm  subserosal fibroid is identified in the RIGHT mid uterus. Small nabothian cysts. Endometrium Thickness: 7.9 millimeters. No focal abnormality visualized. Right ovary Measurements: 6.2 x 4.0 x 4.4 centimeters = volume: 55.7 mL. A mass measuring 4.6 x 3.5 x 3.9 centimeters contains reticular internal echoes. There is no internal blood flow. No definitive solid component. Left ovary Measurements: Not visualized.  Follow-up ultrasound in 8 to 12 weeks was recommended.  Endometrial biopsy April 10 revealed very scanty and minute fragments of detached endometrial glands showing  tubal metaplasia and features suggestive of stromal breakdown. Suboptimal sampling for definitive diagnosis. She underwent hysteroscopy, dilatation and curettage on May 23.  Pathology did not reveal any endometrial hyperplasia, cervical dysplasia or malignancy.  She states she has not had any bleeding since the procedure.  She was found to have hepatitis C in the fall 2023 and was seen by Annamarie Major, NP December.  She was treated with Epclusa beginning in January and completed in April.  Testing for hepatitis C in February and April was negative.  Of note, ultrasound prior to Epclusa revealed fatty liver infiltration.  She states she was not treated for vitamin D deficiency due to being on Epclusa for her hepatitis C.  Medical history: As above.  Depression.  Status post cholecystectomy and tubal ligation.  She is up-to-date on annual screening mammogram.  She has never had a screening colonoscopy.  Social history: She smokes about a half a pack of cigarettes daily for the past 2 years.  She denies previous or current alcohol or other substance use.  She is divorced with 3 children a 45 year old, 68-year-old and 59-year-old.  She works full-time in Community education officer.  Family history: She has a sister with iron deficiency who receives IV iron replacement on a regular.  Her maternal grandfather had melanoma over age 40.  There is no other family history of hematologic abnormality or malignancy.   REVIEW OF SYSTEMS:  Review of Systems  Constitutional:  Positive for fatigue. Negative for appetite change, chills, fever and unexpected weight change.  HENT:  Negative for lump/mass, mouth sores and sore throat.   Respiratory:  Negative for cough and shortness of breath.   Cardiovascular:  Negative for chest pain and leg swelling.  Gastrointestinal:  Negative for abdominal pain, blood in stool, constipation, diarrhea, nausea and vomiting.  Endocrine: Negative for hot flashes.  Genitourinary:  Positive for  menstrual problem. Negative for difficulty urinating, dysuria, frequency and hematuria.   Musculoskeletal:  Negative for arthralgias, back pain and myalgias.  Skin:  Negative for rash.  Neurological:  Negative for dizziness and headaches.  Hematological:  Negative for adenopathy. Does not bruise/bleed easily.  Psychiatric/Behavioral:  Negative for depression and sleep disturbance. The patient is not nervous/anxious.      VITALS:  Blood pressure 125/78, pulse 67, temperature 98.4 F (36.9 C), temperature source Oral, resp. rate 20, height 5\' 1"  (1.549 m), weight 164 lb 9.6 oz (74.7 kg), SpO2 100 %,  Wt Readings from Last 3 Encounters:  05/23/23 164 lb 9.6 oz (74.7 kg)  06/08/16 178 lb 4 oz (80.9 kg)  05/10/16 176 lb 6.4 oz (80 kg)    Body mass index is 31.1 kg/m.  Performance status (ECOG): 1 - Symptomatic but completely ambulatory  PHYSICAL EXAM:  Physical Exam Vitals and nursing note reviewed.  Constitutional:      General: She is not in acute distress.    Appearance: Normal appearance.  HENT:     Head: Normocephalic and atraumatic.     Mouth/Throat:     Mouth: Mucous membranes are moist.     Pharynx: Oropharynx is clear. No oropharyngeal exudate or posterior oropharyngeal erythema.  Eyes:     General: No scleral icterus.    Extraocular Movements: Extraocular movements intact.     Conjunctiva/sclera: Conjunctivae normal.     Pupils: Pupils are equal, round, and reactive to light.  Cardiovascular:     Rate and Rhythm: Normal rate and regular rhythm.     Heart sounds: Normal heart sounds. No murmur heard.    No friction rub. No gallop.  Pulmonary:     Effort: Pulmonary effort is normal.     Breath sounds: Normal breath sounds. No wheezing, rhonchi or rales.  Abdominal:     General: There is no distension.     Palpations: Abdomen is soft. There is no hepatomegaly, splenomegaly or mass.     Tenderness: There is no abdominal tenderness.  Musculoskeletal:        General:  Normal range of motion.     Cervical back: Normal range of motion and neck supple. No tenderness.     Right lower leg: No edema.     Left lower leg: No edema.  Lymphadenopathy:     Cervical: No cervical adenopathy.     Upper Body:     Right upper body: No supraclavicular or axillary adenopathy.     Left upper body: No supraclavicular or axillary adenopathy.     Lower Body: No right inguinal adenopathy. No left inguinal adenopathy.  Skin:    General: Skin is warm and dry.     Coloration: Skin is not jaundiced.     Findings: No rash.  Neurological:     Mental Status: She is alert and oriented to person, place, and time.     Cranial Nerves: No cranial nerve deficit.  Psychiatric:        Mood and Affect: Mood normal.        Behavior: Behavior normal.        Thought Content: Thought content normal.  LABS:       No data to display             No data to display           Lab Results  Component Value Date   FERRITIN 2 (L) 05/23/2023   Lab Results  Component Value Date   LDH 115 05/23/2023    STUDIES:  No results found.    HISTORY:   Past Medical History:  Diagnosis Date   Anemia    Anxiety    Depression    Hepatitis C    Medical history non-contributory     Past Surgical History:  Procedure Laterality Date   CHOLECYSTECTOMY, LAPAROSCOPIC     LAPAROSCOPIC GASTRIC SLEEVE RESECTION     LAPAROSCOPIC GASTRIC SLEEVE RESECTION  2016   LAPAROSCOPIC OVARIAN CYSTECTOMY      Family History  Problem Relation Age of Onset   Heart failure Mother    Hypertension Mother    Diabetes Mother    Kidney disease Mother    Pulmonary disease Mother    Heart attack Father    Melanoma Maternal Grandfather    Diabetes Paternal Grandmother    Diabetes Paternal Grandfather     Social History:  reports that she has quit smoking. Her smoking use included cigarettes. She has a 1.00 pack-year smoking history. She has never used smokeless tobacco. She reports that she  does not currently use alcohol. She reports that she does not use drugs.The patient is alone today.  Allergies: No Known Allergies  Current Medications: Current Outpatient Medications  Medication Sig Dispense Refill   folic acid (FOLVITE) 1 MG tablet Take 1 tablet (1 mg total) by mouth daily. 90 tablet 1   IRON, FERROUS SULFATE, PO Take 1 tablet by mouth 2 (two) times daily. 325mg      Prenatal Vit-Fe Fumarate-FA (PRENATAL MULTIVITAMIN) TABS tablet Take 1 tablet by mouth daily at 12 noon. (Patient not taking: Reported on 05/23/2023)     sertraline (ZOLOFT) 50 MG tablet Take 1 tablet by mouth at bedtime. 1 to 1/2 tabs q hs     No current facility-administered medications for this visit.     ASSESSMENT & PLAN:   Assessment:  Heidi [name] is a 47 y.o. female with iron deficiency anemia most likely due to heavy menses and possibly decreased absorption due to her gastric sleeve.  Plan: 1.  Evaluate for other vitamin and trace mineral deficiency, as well as hemolysis 2.  Give IV iron replacement in the upcoming days 3.  GI referral for colon cancer screening  I discussed the assessment and treatment plan with the patient.  The patient was provided an opportunity to ask questions and all were answered.  The patient agreed with the plan and demonstrated an understanding of the instructions.  I will plan to see her back in 6 weeks for repeat clinical assessment.  Thank you for the referral.   I provided 45 minutes of face-to-face time during this encounter and > 50% was spent counseling as documented under my assessment and plan.    Adah Perl, PA-C   Physician Assistant [facility] [place] [phone]

## 2023-05-24 ENCOUNTER — Telehealth: Payer: Self-pay

## 2023-05-24 ENCOUNTER — Encounter: Payer: Medicaid Other | Admitting: Hematology and Oncology

## 2023-05-24 ENCOUNTER — Other Ambulatory Visit: Payer: Medicaid Other

## 2023-05-24 ENCOUNTER — Telehealth: Payer: Self-pay | Admitting: Hematology and Oncology

## 2023-05-24 MED ORDER — FOLIC ACID 1 MG PO TABS: 1.0000 mg | ORAL_TABLET | Freq: Every day | ORAL | 1 refills | Status: DC

## 2023-05-24 NOTE — Telephone Encounter (Signed)
Patient has been scheduled. Aware of appt date and time    Scheduling Message Entered by Belva Crome A on 05/23/2023 at  4:32 PM Priority: High <No visit type provided>  Department: [facility]  Provider:  Scheduling Notes:  1. Please schedule venofer IV for 5 doses.  2. Labs and f/u with Kelli in 6 weeks

## 2023-05-24 NOTE — Telephone Encounter (Signed)
-----   Message from Adah Perl, PA-C sent at 05/24/2023  2:55 PM EDT ----- Please let her know iron remains low. Her folic acid is also low. I have sent in a prescription for folic acid 1 mg daily. Scheduling should be calling her to set up iron infusions. Thanks

## 2023-05-24 NOTE — Telephone Encounter (Signed)
Patient notified and voiced understanding.

## 2023-05-25 LAB — HAPTOGLOBIN: Haptoglobin: 169 mg/dL (ref 42–296)

## 2023-05-25 LAB — ZINC: Zinc: 76 ug/dL (ref 44–115)

## 2023-05-25 LAB — COPPER, SERUM: Copper: 177 ug/dL — ABNORMAL HIGH (ref 80–158)

## 2023-05-27 ENCOUNTER — Encounter: Payer: Self-pay | Admitting: Hematology and Oncology

## 2023-05-30 ENCOUNTER — Ambulatory Visit: Payer: Medicaid Other

## 2023-05-31 MED FILL — Iron Sucrose Inj 20 MG/ML (Fe Equiv): INTRAVENOUS | Qty: 10 | Status: AC

## 2023-06-03 ENCOUNTER — Inpatient Hospital Stay: Payer: Medicaid Other

## 2023-06-03 VITALS — BP 114/61 | HR 61 | Temp 98.1°F | Resp 16 | Ht 61.0 in | Wt 165.0 lb

## 2023-06-03 DIAGNOSIS — D500 Iron deficiency anemia secondary to blood loss (chronic): Secondary | ICD-10-CM

## 2023-06-03 DIAGNOSIS — D509 Iron deficiency anemia, unspecified: Secondary | ICD-10-CM | POA: Diagnosis not present

## 2023-06-03 MED ORDER — SODIUM CHLORIDE 0.9 % IV SOLN: Freq: Once | INTRAVENOUS | Status: AC

## 2023-06-03 MED ORDER — SODIUM CHLORIDE 0.9 % IV SOLN
200.0000 mg | Freq: Once | INTRAVENOUS | Status: AC
  Administered 2023-06-03: 200 mg via INTRAVENOUS
  Filled 2023-06-03: qty 200

## 2023-06-03 NOTE — Patient Instructions (Signed)

## 2023-06-04 MED FILL — Iron Sucrose Inj 20 MG/ML (Fe Equiv): INTRAVENOUS | Qty: 10 | Status: AC

## 2023-06-05 ENCOUNTER — Inpatient Hospital Stay: Payer: Medicaid Other

## 2023-06-05 ENCOUNTER — Ambulatory Visit: Payer: Medicaid Other

## 2023-06-05 VITALS — BP 114/71 | HR 60 | Temp 98.7°F | Resp 16 | Ht 61.0 in | Wt 164.1 lb

## 2023-06-05 DIAGNOSIS — D509 Iron deficiency anemia, unspecified: Secondary | ICD-10-CM | POA: Diagnosis not present

## 2023-06-05 DIAGNOSIS — D500 Iron deficiency anemia secondary to blood loss (chronic): Secondary | ICD-10-CM

## 2023-06-05 MED ORDER — SODIUM CHLORIDE 0.9 % IV SOLN
200.0000 mg | Freq: Once | INTRAVENOUS | Status: AC
  Administered 2023-06-05: 200 mg via INTRAVENOUS
  Filled 2023-06-05: qty 200

## 2023-06-05 MED ORDER — SODIUM CHLORIDE 0.9 % IV SOLN: Freq: Once | INTRAVENOUS | Status: AC

## 2023-06-05 NOTE — Patient Instructions (Signed)

## 2023-06-06 ENCOUNTER — Inpatient Hospital Stay: Payer: Medicaid Other

## 2023-06-06 VITALS — BP 121/52 | HR 63 | Temp 97.7°F | Resp 16 | Ht 61.0 in | Wt 165.0 lb

## 2023-06-06 DIAGNOSIS — D509 Iron deficiency anemia, unspecified: Secondary | ICD-10-CM | POA: Diagnosis not present

## 2023-06-06 DIAGNOSIS — D500 Iron deficiency anemia secondary to blood loss (chronic): Secondary | ICD-10-CM

## 2023-06-06 MED ORDER — SODIUM CHLORIDE 0.9 % IV SOLN
200.0000 mg | Freq: Once | INTRAVENOUS | Status: AC
  Administered 2023-06-06: 200 mg via INTRAVENOUS
  Filled 2023-06-06: qty 200

## 2023-06-06 MED ORDER — SODIUM CHLORIDE 0.9 % IV SOLN: Freq: Once | INTRAVENOUS | Status: AC

## 2023-06-06 NOTE — Patient Instructions (Signed)

## 2023-06-10 MED FILL — Iron Sucrose Inj 20 MG/ML (Fe Equiv): INTRAVENOUS | Qty: 10 | Status: AC

## 2023-06-11 ENCOUNTER — Inpatient Hospital Stay: Payer: Medicaid Other

## 2023-06-11 ENCOUNTER — Ambulatory Visit: Payer: Medicaid Other

## 2023-06-11 VITALS — BP 106/73 | HR 77 | Temp 98.0°F | Resp 18

## 2023-06-11 DIAGNOSIS — D509 Iron deficiency anemia, unspecified: Secondary | ICD-10-CM | POA: Diagnosis not present

## 2023-06-11 DIAGNOSIS — D500 Iron deficiency anemia secondary to blood loss (chronic): Secondary | ICD-10-CM

## 2023-06-11 MED ORDER — SODIUM CHLORIDE 0.9 % IV SOLN: Freq: Once | INTRAVENOUS | Status: AC

## 2023-06-11 MED ORDER — SODIUM CHLORIDE 0.9 % IV SOLN
200.0000 mg | Freq: Once | INTRAVENOUS | Status: AC
  Administered 2023-06-11: 200 mg via INTRAVENOUS
  Filled 2023-06-11: qty 200

## 2023-06-12 ENCOUNTER — Ambulatory Visit: Payer: Medicaid Other

## 2023-06-12 MED FILL — Iron Sucrose Inj 20 MG/ML (Fe Equiv): INTRAVENOUS | Qty: 10 | Status: AC

## 2023-06-13 ENCOUNTER — Inpatient Hospital Stay: Payer: Medicaid Other

## 2023-06-13 VITALS — BP 103/66 | HR 60 | Temp 98.0°F | Resp 16 | Ht 61.0 in | Wt 167.2 lb

## 2023-06-13 DIAGNOSIS — D500 Iron deficiency anemia secondary to blood loss (chronic): Secondary | ICD-10-CM

## 2023-06-13 DIAGNOSIS — D509 Iron deficiency anemia, unspecified: Secondary | ICD-10-CM | POA: Diagnosis not present

## 2023-06-13 MED ORDER — SODIUM CHLORIDE 0.9 % IV SOLN: Freq: Once | INTRAVENOUS | Status: AC

## 2023-06-13 MED ORDER — SODIUM CHLORIDE 0.9 % IV SOLN
200.0000 mg | Freq: Once | INTRAVENOUS | Status: AC
  Administered 2023-06-13: 200 mg via INTRAVENOUS
  Filled 2023-06-13: qty 200

## 2023-06-13 NOTE — Patient Instructions (Signed)

## 2023-07-03 NOTE — Progress Notes (Deleted)
[facility] Cancer Follow up Visit:   Patient Care Team: Maris Berger, MD as PCP - General (Family Medicine)  CHIEF COMPLAINTS/PURPOSE OF CONSULTATION:  Oncology History   No history exists.    HISTORY OF PRESENTING ILLNESS: Heidi [name] 47 y.o. female is here because of  ***  Review of Systems - Oncology  MEDICAL HISTORY: Past Medical History:  Diagnosis Date   Anemia    Anxiety    Depression    Hepatitis C    Medical history non-contributory     SURGICAL HISTORY: Past Surgical History:  Procedure Laterality Date   CHOLECYSTECTOMY, LAPAROSCOPIC     LAPAROSCOPIC GASTRIC SLEEVE RESECTION     LAPAROSCOPIC GASTRIC SLEEVE RESECTION  2016   LAPAROSCOPIC OVARIAN CYSTECTOMY      SOCIAL HISTORY: Social History   Socioeconomic History   Marital status: Single    Spouse name: Not on file   Number of children: 3   Years of education: 14   Highest education level: Associate degree: occupational, Scientist, product/process development, or vocational program  Occupational History   Occupation: insurance  Tobacco Use   Smoking status: Former    Current packs/day: 0.50    Average packs/day: 0.5 packs/day for 2.0 years (1.0 ttl pk-yrs)    Types: Cigarettes   Smokeless tobacco: Never  Vaping Use   Vaping status: Never Used  Substance and Sexual Activity   Alcohol use: Not Currently    Comment: not during pregnancy    Drug use: No   Sexual activity: Not on file  Other Topics Concern   Not on file  Social History Narrative   Not on file   Social Determinants of Health   Financial Resource Strain: Not on file  Food Insecurity: No Food Insecurity (05/23/2023)   Hunger Vital Sign    Worried About Running Out of Food in the Last Year: Never true    Ran Out of Food in the Last Year: Never true  Transportation Needs: No Transportation Needs (05/23/2023)   PRAPARE - Administrator, Civil Service (Medical): No    Lack of Transportation (Non-Medical): No  Physical  Activity: Not on file  Stress: Not on file  Social Connections: Not on file  Intimate Partner Violence: Not At Risk (05/23/2023)   Humiliation, Afraid, Rape, and Kick questionnaire    Fear of Current or Ex-Partner: No    Emotionally Abused: No    Physically Abused: No    Sexually Abused: No    FAMILY HISTORY Family History  Problem Relation Age of Onset   Heart failure Mother    Hypertension Mother    Diabetes Mother    Kidney disease Mother    Pulmonary disease Mother    Heart attack Father    Melanoma Maternal Grandfather    Diabetes Paternal Grandmother    Diabetes Paternal Grandfather     ALLERGIES:  has No Known Allergies.  MEDICATIONS:  Current Outpatient Medications  Medication Sig Dispense Refill   folic acid (FOLVITE) 1 MG tablet Take 1 tablet (1 mg total) by mouth daily. 90 tablet 1   IRON, FERROUS SULFATE, PO Take 1 tablet by mouth 2 (two) times daily. 325mg      Prenatal Vit-Fe Fumarate-FA (PRENATAL MULTIVITAMIN) TABS tablet Take 1 tablet by mouth daily at 12 noon. (Patient not taking: Reported on 05/23/2023)     sertraline (ZOLOFT) 50 MG tablet Take 1 tablet by mouth at bedtime. 1 to 1/2 tabs q hs     No current  facility-administered medications for this visit.    PHYSICAL EXAMINATION:  ECOG PERFORMANCE STATUS: {CHL ONC ECOG [phone]}   There were no vitals filed for this visit.  There were no vitals filed for this visit.   @PHYSEXAMBYAGE @   LABORATORY DATA: I have personally reviewed the data as listed:  No visits with results within 1 Month(s) from this visit.  Latest known visit with results is:  Scanned Document on 06/03/2023  Component Date Value Ref Range Status   HM Mammogram 10/30/2022 0-4 Bi-Rad  0-4 Bi-Rad, Self Reported Normal Final   Abstracted by HIM    RADIOGRAPHIC STUDIES: I have personally reviewed the radiological images as listed and agree with the findings in the report  No results found.  ASSESSMENT/PLAN Cancer  Staging  No matching staging information was found for the patient.    No problem-specific Assessment & Plan notes found for this encounter.    No orders of the defined types were placed in this encounter.     minutes was spent in patient care.  This included time spent preparing to see the patient (e.g., review of tests), obtaining and/or reviewing separately obtained history, counseling and educating the patient/family/caregiver, ordering medications, tests, or procedures; documenting clinical information in the electronic or other health record, independently interpreting results and communicating results to the patient/family/caregiver as well as coordination of care.       All questions were answered. The patient knows to call the clinic with any problems, questions or concerns.  This note was electronically signed.    Adah Perl, PA-C  07/03/2023 5:07 PM

## 2023-07-04 ENCOUNTER — Inpatient Hospital Stay: Payer: Medicaid Other

## 2023-07-04 ENCOUNTER — Inpatient Hospital Stay: Payer: Medicaid Other | Admitting: Hematology and Oncology

## 2023-07-08 ENCOUNTER — Other Ambulatory Visit: Payer: Self-pay | Admitting: Hematology and Oncology

## 2023-07-08 DIAGNOSIS — E538 Deficiency of other specified B group vitamins: Secondary | ICD-10-CM

## 2023-07-08 DIAGNOSIS — D500 Iron deficiency anemia secondary to blood loss (chronic): Secondary | ICD-10-CM

## 2023-07-10 NOTE — Progress Notes (Unsigned)
[facility]  [address] [place],  [place]  [address] [phone]  Clinic Day:  07/11/2023  Referring physician: Maris Berger, MD   HISTORY OF PRESENT ILLNESS:  The patient is a 47 y.o. female with iron deficiency anemia who I began seeing in June.  She was treated with IV iron in the form of Venofer.  She was also found to have folate deficiency and placed on folic acid 1000 mcg daily.  She has a history of treated hepatitis C and follows with hepatology.  Of note, ultrasound prior to Epclusa revealed fatty liver infiltration. Also, she underwent gastric sleeve in 2016.  She is here today for repeat clinical assessment.  She reports persistent fatigue despite receiving IV iron.  She states she continues oral iron twice daily, as well as folic acid daily.  She states she has been menstruating for a week, all days have been heavy with blood clots.  This is despite being on Loestrin continuously as prescribed by Dr. Louellen Molder.   She reports heavy, irregular menses since having her first child in 1998.  An ultrasound in March revealed thickening of the endometrium.  Endometrial biopsy was insufficient for diagnosis.  She had hysteroscopy and endometrial curettage in May.  Biopsy did not reveal endometrial hyperplasia, cervical dysplasia or malignancy.   She previously had an IUD, but continued to have heavy menses.  She states she was on Depo-Provera in the remote past for birth control, but it caused 100 pound weight gain.  Hepatic panel and PT at hepatology last week were normal.  Bilateral screening mammogram November 2023 did not reveal any evidence of malignancy.  She has never had a colonoscopy.  PHYSICAL EXAM:  Blood pressure 125/89, pulse 77, temperature 98.6 F (37 C), temperature source Oral, resp. rate 18, height 5\' 1"  (1.549 m), weight 166 lb (75.3 kg), SpO2 100%. Wt Readings from Last 3 Encounters:  07/11/23 166 lb (75.3 kg)  06/13/23 167 lb 4 oz  (75.9 kg)  06/06/23 165 lb 0.6 oz (74.9 kg)   Body mass index is 31.37 kg/m.  Performance status (ECOG): 1 - Symptomatic but completely ambulatory  Physical Exam Vitals and nursing note reviewed.  Constitutional:      General: She is not in acute distress.    Appearance: Normal appearance.  HENT:     Head: Normocephalic and atraumatic.     Mouth/Throat:     Mouth: Mucous membranes are moist.     Pharynx: Oropharynx is clear. No oropharyngeal exudate or posterior oropharyngeal erythema.  Eyes:     General: No scleral icterus.    Extraocular Movements: Extraocular movements intact.     Conjunctiva/sclera: Conjunctivae normal.     Pupils: Pupils are equal, round, and reactive to light.  Cardiovascular:     Rate and Rhythm: Normal rate and regular rhythm.     Heart sounds: Normal heart sounds. No murmur heard.    No friction rub. No gallop.  Pulmonary:     Effort: Pulmonary effort is normal.     Breath sounds: Normal breath sounds. No wheezing, rhonchi or rales.  Abdominal:     General: There is no distension.     Palpations: Abdomen is soft. There is no hepatomegaly, splenomegaly or mass.     Tenderness: There is no abdominal tenderness.  Musculoskeletal:        General: Normal range of motion.     Cervical back: Normal range of motion and neck supple. No tenderness.  Right lower leg: No edema.     Left lower leg: No edema.  Lymphadenopathy:     Cervical: No cervical adenopathy.     Upper Body:     Right upper body: No supraclavicular or axillary adenopathy.     Left upper body: No supraclavicular or axillary adenopathy.     Lower Body: No right inguinal adenopathy. No left inguinal adenopathy.  Skin:    General: Skin is warm and dry.     Coloration: Skin is not jaundiced.     Findings: No rash.  Neurological:     Mental Status: She is alert and oriented to person, place, and time.     Cranial Nerves: No cranial nerve deficit.  Psychiatric:        Mood and Affect:  Mood normal.        Behavior: Behavior normal.        Thought Content: Thought content normal.     LABS:      Latest Ref Rng & Units 07/11/2023    8:41 AM  CBC  WBC 4.0 - 10.5 K/uL 7.0   Hemoglobin 12.0 - 15.0 g/dL 82.9   Hematocrit 56.2 - 46.0 % 39.0   Platelets 150 - 400 K/uL 257        No data to display           No results found for: "CEA1", "CEA" / No results found for: "CEA1", "CEA" No results found for: "PSA1" No results found for: "ZHY865" No results found for: "CAN125"  No results found for: "TOTALPROTELP", "ALBUMINELP", "A1GS", "A2GS", "BETS", "BETA2SER", "GAMS", "MSPIKE", "SPEI"  Lab Results  Component Value Date   FERRITIN 66 07/11/2023   FERRITIN 2 (L) 05/23/2023   Lab Results  Component Value Date   LDH 115 05/23/2023       Component Value Date/Time   LDH 115 05/23/2023 1538    Review Flowsheet       Latest Ref Rng & Units 05/23/2023 07/11/2023  Oncology Labs  Ferritin 11 - 307 ng/mL 2  66   LDH 98 - 192 U/L 115  -    Details             STUDIES:  No results found.    ASSESSMENT & PLAN:   Assessment/Plan:  47 y.o. female with both iron and folate deficiency.  Her hemoglobin has improved significantly.  Unfortunately, she is having excessive bleeding at this time.  She does not have evidence of liver disease causing this. I will evaluate for von Willebrand disease as possible cause of her excessive bleeding.  Her follow-up will be based on the results of this testing.  She should undergo screening for colorectal cancer.  As she does not have a family history of malignancy, this could be done via Cologuard versus colonoscopy.  She will discuss this with Dr. Laymond Purser.  The patient understands all the plans discussed today and is in agreement with them.  She knows to contact our office if she develops concerns prior to her next appointment.     Adah Perl, PA-C   Physician Assistant [facility] [place] [phone]

## 2023-07-11 ENCOUNTER — Inpatient Hospital Stay: Payer: Medicaid Other | Attending: Hematology and Oncology

## 2023-07-11 ENCOUNTER — Encounter: Payer: Self-pay | Admitting: Hematology and Oncology

## 2023-07-11 ENCOUNTER — Inpatient Hospital Stay (INDEPENDENT_AMBULATORY_CARE_PROVIDER_SITE_OTHER): Payer: Medicaid Other | Admitting: Hematology and Oncology

## 2023-07-11 VITALS — BP 125/89 | HR 77 | Temp 98.6°F | Resp 18 | Ht 61.0 in | Wt 166.0 lb

## 2023-07-11 DIAGNOSIS — N921 Excessive and frequent menstruation with irregular cycle: Secondary | ICD-10-CM

## 2023-07-11 DIAGNOSIS — Z79899 Other long term (current) drug therapy: Secondary | ICD-10-CM | POA: Diagnosis not present

## 2023-07-11 DIAGNOSIS — R5383 Other fatigue: Secondary | ICD-10-CM | POA: Insufficient documentation

## 2023-07-11 DIAGNOSIS — Z8619 Personal history of other infectious and parasitic diseases: Secondary | ICD-10-CM | POA: Diagnosis not present

## 2023-07-11 DIAGNOSIS — Z9884 Bariatric surgery status: Secondary | ICD-10-CM | POA: Insufficient documentation

## 2023-07-11 DIAGNOSIS — F17210 Nicotine dependence, cigarettes, uncomplicated: Secondary | ICD-10-CM | POA: Diagnosis not present

## 2023-07-11 DIAGNOSIS — N920 Excessive and frequent menstruation with regular cycle: Secondary | ICD-10-CM | POA: Diagnosis not present

## 2023-07-11 DIAGNOSIS — D509 Iron deficiency anemia, unspecified: Secondary | ICD-10-CM | POA: Diagnosis not present

## 2023-07-11 DIAGNOSIS — D500 Iron deficiency anemia secondary to blood loss (chronic): Secondary | ICD-10-CM

## 2023-07-11 LAB — CBC WITH DIFFERENTIAL (CANCER CENTER ONLY)
Abs Immature Granulocytes: 0.01 10*3/uL (ref 0.00–0.07)
Basophils Absolute: 0 10*3/uL (ref 0.0–0.1)
Basophils Relative: 1 %
Eosinophils Absolute: 0.3 10*3/uL (ref 0.0–0.5)
Eosinophils Relative: 4 %
HCT: 39 % (ref 36.0–46.0)
Hemoglobin: 11.6 g/dL — ABNORMAL LOW (ref 12.0–15.0)
Immature Granulocytes: 0 %
Lymphocytes Relative: 36 %
Lymphs Abs: 2.5 10*3/uL (ref 0.7–4.0)
MCH: 25.2 pg — ABNORMAL LOW (ref 26.0–34.0)
MCHC: 29.7 g/dL — ABNORMAL LOW (ref 30.0–36.0)
MCV: 84.8 fL (ref 80.0–100.0)
Monocytes Absolute: 0.3 10*3/uL (ref 0.1–1.0)
Monocytes Relative: 4 %
Neutro Abs: 3.8 10*3/uL (ref 1.7–7.7)
Neutrophils Relative %: 55 %
Platelet Count: 257 10*3/uL (ref 150–400)
RBC: 4.6 MIL/uL (ref 3.87–5.11)
WBC Count: 7 10*3/uL (ref 4.0–10.5)
nRBC: 0 % (ref 0.0–0.2)

## 2023-07-11 LAB — PROTIME-INR
INR: 1 (ref 0.8–1.2)
Prothrombin Time: 13.5 seconds (ref 11.4–15.2)

## 2023-07-11 LAB — FIBRINOGEN: Fibrinogen: 521 mg/dL — ABNORMAL HIGH (ref 210–475)

## 2023-07-11 LAB — FERRITIN: Ferritin: 66 ng/mL (ref 11–307)

## 2023-07-11 LAB — APTT: aPTT: 26 seconds (ref 24–36)

## 2023-07-11 LAB — FOLATE: Folate: 12.7 ng/mL (ref >5.9)

## 2023-07-17 ENCOUNTER — Telehealth: Payer: Self-pay | Admitting: Hematology and Oncology

## 2023-07-17 NOTE — Telephone Encounter (Signed)
Patient has been scheduled. Aware of appt date and time    Scheduling Message Entered by Belva Crome A on 07/17/2023 at  8:37 AM Priority: Routine <No visit type provided>  Department: [facility]  Provider:  Scheduling Notes:  Labs and f/u with [facility] in 7 weeks

## 2023-08-22 ENCOUNTER — Other Ambulatory Visit: Payer: Self-pay | Admitting: Hematology and Oncology

## 2023-08-22 DIAGNOSIS — D500 Iron deficiency anemia secondary to blood loss (chronic): Secondary | ICD-10-CM

## 2023-09-04 ENCOUNTER — Encounter: Payer: Self-pay | Admitting: Hematology and Oncology

## 2023-09-04 ENCOUNTER — Inpatient Hospital Stay: Payer: Medicaid Other | Attending: Hematology and Oncology | Admitting: Hematology and Oncology

## 2023-09-04 ENCOUNTER — Inpatient Hospital Stay: Payer: Medicaid Other

## 2023-09-04 VITALS — BP 104/88 | HR 61 | Temp 97.8°F | Resp 18 | Ht 61.0 in | Wt 167.4 lb

## 2023-09-04 DIAGNOSIS — F17210 Nicotine dependence, cigarettes, uncomplicated: Secondary | ICD-10-CM | POA: Diagnosis not present

## 2023-09-04 DIAGNOSIS — Z9884 Bariatric surgery status: Secondary | ICD-10-CM | POA: Insufficient documentation

## 2023-09-04 DIAGNOSIS — D500 Iron deficiency anemia secondary to blood loss (chronic): Secondary | ICD-10-CM

## 2023-09-04 DIAGNOSIS — E538 Deficiency of other specified B group vitamins: Secondary | ICD-10-CM | POA: Insufficient documentation

## 2023-09-04 DIAGNOSIS — Z79899 Other long term (current) drug therapy: Secondary | ICD-10-CM | POA: Insufficient documentation

## 2023-09-04 DIAGNOSIS — N920 Excessive and frequent menstruation with regular cycle: Secondary | ICD-10-CM | POA: Diagnosis not present

## 2023-09-04 DIAGNOSIS — Z8619 Personal history of other infectious and parasitic diseases: Secondary | ICD-10-CM | POA: Diagnosis not present

## 2023-09-04 DIAGNOSIS — D509 Iron deficiency anemia, unspecified: Secondary | ICD-10-CM | POA: Diagnosis present

## 2023-09-04 LAB — VITAMIN B12: Vitamin B-12: 433 pg/mL (ref 180–914)

## 2023-09-04 LAB — CBC WITH DIFFERENTIAL (CANCER CENTER ONLY)
Abs Immature Granulocytes: 0.03 10*3/uL (ref 0.00–0.07)
Basophils Absolute: 0.1 10*3/uL (ref 0.0–0.1)
Basophils Relative: 1 %
Eosinophils Absolute: 0.3 10*3/uL (ref 0.0–0.5)
Eosinophils Relative: 5 %
HCT: 38.2 % (ref 36.0–46.0)
Hemoglobin: 12 g/dL (ref 12.0–15.0)
Immature Granulocytes: 0 %
Lymphocytes Relative: 40 %
Lymphs Abs: 2.7 10*3/uL (ref 0.7–4.0)
MCH: 28.2 pg (ref 26.0–34.0)
MCHC: 31.4 g/dL (ref 30.0–36.0)
MCV: 89.7 fL (ref 80.0–100.0)
Monocytes Absolute: 0.4 10*3/uL (ref 0.1–1.0)
Monocytes Relative: 5 %
Neutro Abs: 3.2 10*3/uL (ref 1.7–7.7)
Neutrophils Relative %: 49 %
Platelet Count: 280 10*3/uL (ref 150–400)
RBC: 4.26 MIL/uL (ref 3.87–5.11)
RDW: 15.5 % (ref 11.5–15.5)
WBC Count: 6.7 10*3/uL (ref 4.0–10.5)
nRBC: 0 % (ref 0.0–0.2)

## 2023-09-04 LAB — FOLATE: Folate: 4.9 ng/mL — ABNORMAL LOW (ref >5.9)

## 2023-09-04 LAB — IRON AND TIBC
Iron: 37 ug/dL (ref 28–170)
Saturation Ratios: 9 % — ABNORMAL LOW (ref 10.4–31.8)
TIBC: 399 ug/dL (ref 250–450)
UIBC: 362 ug/dL

## 2023-09-04 LAB — FERRITIN: Ferritin: 10 ng/mL — ABNORMAL LOW (ref 11–307)

## 2023-09-04 NOTE — Progress Notes (Cosign Needed Addendum)
[facility]  [address] [place],  [place]  [address] [phone]   Addendum: Her ferritin is very low again at 10, so I will arrange for her to receive IV iron again.  Clinic Day:  09/04/2023  Referring physician: Maris Berger, MD   HISTORY OF PRESENT ILLNESS:  The patient is a 47 y.o. female with iron deficiency anemia felt to be related to menorrhagia who I began seeing in June. Testing for von Willebrand disorder was negative. She was treated with IV Venofer in June. She was also found to have folate deficiency for which she is on folic acid 1000 mcg daily. She is here for repeat clinical assessment and reports persistent fatigue.  She continues to have very heavy menses about every 4 weeks lasting 1 to 2 weeks with blood clots, she is currently menstruating.  She denies any other overt form of.  She has been caring for her mother with heart failure and renal failure.  PHYSICAL EXAM:  Blood pressure 104/88, pulse 61, temperature 97.8 F (36.6 C), temperature source Oral, resp. rate 18, height 5\' 1"  (1.549 m), weight 167 lb 6.4 oz (75.9 kg), last menstrual period 09/02/2023, SpO2 100%. Wt Readings from Last 3 Encounters:  09/04/23 167 lb 6.4 oz (75.9 kg)  07/11/23 166 lb (75.3 kg)  06/13/23 167 lb 4 oz (75.9 kg)   Body mass index is 31.63 kg/m.  Performance status (ECOG): 1 - Symptomatic but completely ambulatory  Physical Exam Vitals and nursing note reviewed.  Constitutional:      General: She is not in acute distress.    Appearance: Normal appearance.  HENT:     Head: Normocephalic and atraumatic.     Mouth/Throat:     Mouth: Mucous membranes are moist.     Pharynx: Oropharynx is clear. No oropharyngeal exudate or posterior oropharyngeal erythema.  Eyes:     General: No scleral icterus.    Extraocular Movements: Extraocular movements intact.     Conjunctiva/sclera: Conjunctivae normal.     Pupils: Pupils are equal, round, and  reactive to light.  Cardiovascular:     Rate and Rhythm: Normal rate and regular rhythm.     Heart sounds: Normal heart sounds. No murmur heard.    No friction rub. No gallop.  Pulmonary:     Effort: Pulmonary effort is normal.     Breath sounds: Normal breath sounds. No wheezing, rhonchi or rales.  Abdominal:     General: There is no distension.     Palpations: Abdomen is soft. There is no hepatomegaly, splenomegaly or mass.     Tenderness: There is no abdominal tenderness.  Musculoskeletal:        General: Normal range of motion.     Cervical back: Normal range of motion and neck supple. No tenderness.     Right lower leg: No edema.     Left lower leg: No edema.  Lymphadenopathy:     Cervical: No cervical adenopathy.     Upper Body:     Right upper body: No supraclavicular or axillary adenopathy.     Left upper body: No supraclavicular or axillary adenopathy.     Lower Body: No right inguinal adenopathy. No left inguinal adenopathy.  Skin:    General: Skin is warm and dry.     Coloration: Skin is not jaundiced.     Findings: No rash.  Neurological:     Mental Status: She is alert and oriented to person, place, and  time.     Cranial Nerves: No cranial nerve deficit.  Psychiatric:        Mood and Affect: Mood normal.        Behavior: Behavior normal.        Thought Content: Thought content normal.     LABS:      Latest Ref Rng & Units 09/04/2023    8:44 AM 07/11/2023    8:41 AM  CBC  WBC 4.0 - 10.5 K/uL 6.7  7.0   Hemoglobin 12.0 - 15.0 g/dL 45.8  09.9   Hematocrit 36.0 - 46.0 % 38.2  39.0   Platelets 150 - 400 K/uL 280  257        No data to display           Lab Results  Component Value Date   TIBC 399 09/04/2023   FERRITIN 10 (L) 09/04/2023   FERRITIN 66 07/11/2023   FERRITIN 2 (L) 05/23/2023   IRONPCTSAT 9 (L) 09/04/2023   Lab Results  Component Value Date   LDH 115 05/23/2023       Component Value Date/Time   LDH 115 05/23/2023 1538     Review Flowsheet  More data may exist      Latest Ref Rng & Units 05/23/2023 07/11/2023 09/04/2023  Oncology Labs  Ferritin 11 - 307 ng/mL 2  66  10   %SAT 10.4 - 31.8 % - - 9   LDH 98 - 192 U/L 115  - -    Details             STUDIES:  No results found.    ASSESSMENT & PLAN:   Assessment/Plan:  47 y.o. female with iron deficiency anemia felt to be due to menorrhagia.  Her hemoglobin is normal today. Her folate is low, so will ask her to increase her folic acid.  Nutritional studies are pending.  If her iron levels returned low, we will plan another course of IV Venofer.  We will otherwise plan to see her back in 3 months for repeat clinical assessment.  The patient understands all the plans discussed today and is in agreement with them.  She knows to contact our office if she develops concerns prior to her next appointment.     Adah Perl, PA-C   Physician Assistant [facility] [place] [phone]

## 2023-09-04 NOTE — Addendum Note (Signed)
Addended by: Belva Crome A on: 09/04/2023 09:23 PM   Modules accepted: Orders

## 2023-09-05 ENCOUNTER — Telehealth: Payer: Self-pay

## 2023-09-05 ENCOUNTER — Telehealth: Payer: Self-pay | Admitting: Hematology and Oncology

## 2023-09-05 NOTE — Telephone Encounter (Signed)
-----   Message from Adah Perl sent at 09/04/2023  9:22 PM EDT ----- Please let her know her iron stores are low again. Will arrange for IV iron. Thanks

## 2023-09-05 NOTE — Telephone Encounter (Signed)
Patient has been scheduled. Aware of appt dates and times.    Scheduling Message Entered by Belva Crome A on 09/04/2023 at  9:21 PM Priority: Routine <No visit type provided>  Department: [facility]  Provider:  Scheduling Notes:  1. Please schedule venofer x 5 next available  2. Labs and f/u with [facility] in 3 months.

## 2023-09-05 NOTE — Telephone Encounter (Signed)
Patient aware and voiced understanding , aware scheduling will her her and set up an appt.

## 2023-09-06 MED FILL — Iron Sucrose Inj 20 MG/ML (Fe Equiv): INTRAVENOUS | Qty: 10 | Status: AC

## 2023-09-09 ENCOUNTER — Encounter: Payer: Self-pay | Admitting: Hematology and Oncology

## 2023-09-09 ENCOUNTER — Inpatient Hospital Stay: Payer: Medicaid Other

## 2023-09-09 VITALS — BP 107/59 | HR 77 | Temp 98.0°F | Resp 18 | Ht 61.0 in | Wt 167.1 lb

## 2023-09-09 DIAGNOSIS — D500 Iron deficiency anemia secondary to blood loss (chronic): Secondary | ICD-10-CM

## 2023-09-09 DIAGNOSIS — D509 Iron deficiency anemia, unspecified: Secondary | ICD-10-CM | POA: Diagnosis not present

## 2023-09-09 MED ORDER — SODIUM CHLORIDE 0.9 % IV SOLN: Freq: Once | INTRAVENOUS | Status: AC

## 2023-09-09 MED ORDER — ACETAMINOPHEN 325 MG PO TABS
650.0000 mg | ORAL_TABLET | Freq: Once | ORAL | Status: AC
  Administered 2023-09-09: 650 mg via ORAL

## 2023-09-09 MED ORDER — LORATADINE 10 MG PO TABS
10.0000 mg | ORAL_TABLET | Freq: Once | ORAL | Status: AC
  Administered 2023-09-09: 10 mg via ORAL
  Filled 2023-09-09: qty 1

## 2023-09-09 MED ORDER — SODIUM CHLORIDE 0.9 % IV SOLN
200.0000 mg | Freq: Once | INTRAVENOUS | Status: AC
  Administered 2023-09-09: 200 mg via INTRAVENOUS
  Filled 2023-09-09: qty 200

## 2023-09-09 MED FILL — Iron Sucrose Inj 20 MG/ML (Fe Equiv): INTRAVENOUS | Qty: 10 | Status: AC

## 2023-09-09 NOTE — Patient Instructions (Signed)
Iron Sucrose Injection What is this medication? IRON SUCROSE (EYE ern SOO krose) treats low levels of iron (iron deficiency anemia) in people with kidney disease. Iron is a mineral that plays an important role in making red blood cells, which carry oxygen from your lungs to the rest of your body. This medicine may be used for other purposes; ask your health care provider or pharmacist if you have questions. COMMON BRAND NAME(S): Venofer What should I tell my care team before I take this medication? They need to know if you have any of these conditions: Anemia not caused by low iron levels Heart disease High levels of iron in the blood Kidney disease Liver disease An unusual or allergic reaction to iron, other medications, foods, dyes, or preservatives Pregnant or trying to get pregnant Breastfeeding How should I use this medication? This medication is for infusion into a vein. It is given in a hospital or clinic setting. Talk to your care team about the use of this medication in children. While this medication may be prescribed for children as young as 2 years for selected conditions, precautions do apply. Overdosage: If you think you have taken too much of this medicine contact a poison control center or emergency room at once. NOTE: This medicine is only for you. Do not share this medicine with others. What if I miss a dose? Keep appointments for follow-up doses. It is important not to miss your dose. Call your care team if you are unable to keep an appointment. What may interact with this medication? Do not take this medication with any of the following: Deferoxamine Dimercaprol Other iron products This medication may also interact with the following: Chloramphenicol Deferasirox This list may not describe all possible interactions. Give your health care provider a list of all the medicines, herbs, non-prescription drugs, or dietary supplements you use. Also tell them if you smoke,  drink alcohol, or use illegal drugs. Some items may interact with your medicine. What should I watch for while using this medication? Visit your care team regularly. Tell your care team if your symptoms do not start to get better or if they get worse. You may need blood work done while you are taking this medication. You may need to follow a special diet. Talk to your care team. Foods that contain iron include: whole grains/cereals, dried fruits, beans, or peas, leafy green vegetables, and organ meats (liver, kidney). What side effects may I notice from receiving this medication? Side effects that you should report to your care team as soon as possible: Allergic reactions--skin rash, itching, hives, swelling of the face, lips, tongue, or throat Low blood pressure--dizziness, feeling faint or lightheaded, blurry vision Shortness of breath Side effects that usually do not require medical attention (report to your care team if they continue or are bothersome): Flushing Headache Joint pain Muscle pain Nausea Pain, redness, or irritation at injection site This list may not describe all possible side effects. Call your doctor for medical advice about side effects. You may report side effects to FDA at 1-800-FDA-1088. Where should I keep my medication? This medication is given in a hospital or clinic. It will not be stored at home. NOTE: This sheet is a summary. It may not cover all possible information. If you have questions about this medicine, talk to your doctor, pharmacist, or health care provider.  2024 Elsevier/Gold Standard (2023-05-10 00:00:00)

## 2023-09-10 ENCOUNTER — Inpatient Hospital Stay: Payer: Medicaid Other

## 2023-09-10 VITALS — BP 118/82 | HR 61 | Temp 97.8°F | Resp 16

## 2023-09-10 DIAGNOSIS — D500 Iron deficiency anemia secondary to blood loss (chronic): Secondary | ICD-10-CM

## 2023-09-10 DIAGNOSIS — D509 Iron deficiency anemia, unspecified: Secondary | ICD-10-CM | POA: Diagnosis not present

## 2023-09-10 MED ORDER — SODIUM CHLORIDE 0.9 % IV SOLN: Freq: Once | INTRAVENOUS | Status: AC

## 2023-09-10 MED ORDER — ACETAMINOPHEN 325 MG PO TABS
650.0000 mg | ORAL_TABLET | Freq: Once | ORAL | Status: AC
  Administered 2023-09-10: 650 mg via ORAL
  Filled 2023-09-10: qty 2

## 2023-09-10 MED ORDER — LORATADINE 10 MG PO TABS
10.0000 mg | ORAL_TABLET | Freq: Once | ORAL | Status: AC
  Administered 2023-09-10: 10 mg via ORAL
  Filled 2023-09-10: qty 1

## 2023-09-10 MED ORDER — SODIUM CHLORIDE 0.9 % IV SOLN
200.0000 mg | Freq: Once | INTRAVENOUS | Status: AC
  Administered 2023-09-10: 200 mg via INTRAVENOUS
  Filled 2023-09-10: qty 10

## 2023-09-10 NOTE — Patient Instructions (Signed)
Iron Sucrose Injection What is this medication? IRON SUCROSE (EYE ern SOO krose) treats low levels of iron (iron deficiency anemia) in people with kidney disease. Iron is a mineral that plays an important role in making red blood cells, which carry oxygen from your lungs to the rest of your body. This medicine may be used for other purposes; ask your health care provider or pharmacist if you have questions. COMMON BRAND NAME(S): Venofer What should I tell my care team before I take this medication? They need to know if you have any of these conditions: Anemia not caused by low iron levels Heart disease High levels of iron in the blood Kidney disease Liver disease An unusual or allergic reaction to iron, other medications, foods, dyes, or preservatives Pregnant or trying to get pregnant Breastfeeding How should I use this medication? This medication is for infusion into a vein. It is given in a hospital or clinic setting. Talk to your care team about the use of this medication in children. While this medication may be prescribed for children as young as 2 years for selected conditions, precautions do apply. Overdosage: If you think you have taken too much of this medicine contact a poison control center or emergency room at once. NOTE: This medicine is only for you. Do not share this medicine with others. What if I miss a dose? Keep appointments for follow-up doses. It is important not to miss your dose. Call your care team if you are unable to keep an appointment. What may interact with this medication? Do not take this medication with any of the following: Deferoxamine Dimercaprol Other iron products This medication may also interact with the following: Chloramphenicol Deferasirox This list may not describe all possible interactions. Give your health care provider a list of all the medicines, herbs, non-prescription drugs, or dietary supplements you use. Also tell them if you smoke,  drink alcohol, or use illegal drugs. Some items may interact with your medicine. What should I watch for while using this medication? Visit your care team regularly. Tell your care team if your symptoms do not start to get better or if they get worse. You may need blood work done while you are taking this medication. You may need to follow a special diet. Talk to your care team. Foods that contain iron include: whole grains/cereals, dried fruits, beans, or peas, leafy green vegetables, and organ meats (liver, kidney). What side effects may I notice from receiving this medication? Side effects that you should report to your care team as soon as possible: Allergic reactions--skin rash, itching, hives, swelling of the face, lips, tongue, or throat Low blood pressure--dizziness, feeling faint or lightheaded, blurry vision Shortness of breath Side effects that usually do not require medical attention (report to your care team if they continue or are bothersome): Flushing Headache Joint pain Muscle pain Nausea Pain, redness, or irritation at injection site This list may not describe all possible side effects. Call your doctor for medical advice about side effects. You may report side effects to FDA at 1-800-FDA-1088. Where should I keep my medication? This medication is given in a hospital or clinic. It will not be stored at home. NOTE: This sheet is a summary. It may not cover all possible information. If you have questions about this medicine, talk to your doctor, pharmacist, or health care provider.  2024 Elsevier/Gold Standard (2023-05-10 00:00:00)

## 2023-09-11 ENCOUNTER — Encounter: Payer: Self-pay | Admitting: Hematology and Oncology

## 2023-09-11 MED FILL — Iron Sucrose Inj 20 MG/ML (Fe Equiv): INTRAVENOUS | Qty: 10 | Status: AC

## 2023-09-12 ENCOUNTER — Inpatient Hospital Stay: Payer: Medicaid Other

## 2023-09-12 VITALS — BP 100/63 | HR 67 | Temp 97.7°F | Resp 18 | Ht 61.0 in | Wt 164.1 lb

## 2023-09-12 DIAGNOSIS — D509 Iron deficiency anemia, unspecified: Secondary | ICD-10-CM | POA: Diagnosis not present

## 2023-09-12 DIAGNOSIS — D500 Iron deficiency anemia secondary to blood loss (chronic): Secondary | ICD-10-CM

## 2023-09-12 MED ORDER — ACETAMINOPHEN 325 MG PO TABS
650.0000 mg | ORAL_TABLET | Freq: Once | ORAL | Status: AC
  Administered 2023-09-12: 650 mg via ORAL
  Filled 2023-09-12: qty 2

## 2023-09-12 MED ORDER — SODIUM CHLORIDE 0.9 % IV SOLN
200.0000 mg | Freq: Once | INTRAVENOUS | Status: AC
  Administered 2023-09-12: 200 mg via INTRAVENOUS
  Filled 2023-09-12: qty 200

## 2023-09-12 MED ORDER — LORATADINE 10 MG PO TABS
10.0000 mg | ORAL_TABLET | Freq: Once | ORAL | Status: AC
  Administered 2023-09-12: 10 mg via ORAL
  Filled 2023-09-12: qty 1

## 2023-09-12 MED ORDER — SODIUM CHLORIDE 0.9 % IV SOLN: Freq: Once | INTRAVENOUS | Status: AC

## 2023-09-12 NOTE — Patient Instructions (Signed)
Iron Sucrose Injection What is this medication? IRON SUCROSE (EYE ern SOO krose) treats low levels of iron (iron deficiency anemia) in people with kidney disease. Iron is a mineral that plays an important role in making red blood cells, which carry oxygen from your lungs to the rest of your body. This medicine may be used for other purposes; ask your health care provider or pharmacist if you have questions. COMMON BRAND NAME(S): Venofer What should I tell my care team before I take this medication? They need to know if you have any of these conditions: Anemia not caused by low iron levels Heart disease High levels of iron in the blood Kidney disease Liver disease An unusual or allergic reaction to iron, other medications, foods, dyes, or preservatives Pregnant or trying to get pregnant Breastfeeding How should I use this medication? This medication is for infusion into a vein. It is given in a hospital or clinic setting. Talk to your care team about the use of this medication in children. While this medication may be prescribed for children as young as 2 years for selected conditions, precautions do apply. Overdosage: If you think you have taken too much of this medicine contact a poison control center or emergency room at once. NOTE: This medicine is only for you. Do not share this medicine with others. What if I miss a dose? Keep appointments for follow-up doses. It is important not to miss your dose. Call your care team if you are unable to keep an appointment. What may interact with this medication? Do not take this medication with any of the following: Deferoxamine Dimercaprol Other iron products This medication may also interact with the following: Chloramphenicol Deferasirox This list may not describe all possible interactions. Give your health care provider a list of all the medicines, herbs, non-prescription drugs, or dietary supplements you use. Also tell them if you smoke,  drink alcohol, or use illegal drugs. Some items may interact with your medicine. What should I watch for while using this medication? Visit your care team regularly. Tell your care team if your symptoms do not start to get better or if they get worse. You may need blood work done while you are taking this medication. You may need to follow a special diet. Talk to your care team. Foods that contain iron include: whole grains/cereals, dried fruits, beans, or peas, leafy green vegetables, and organ meats (liver, kidney). What side effects may I notice from receiving this medication? Side effects that you should report to your care team as soon as possible: Allergic reactions--skin rash, itching, hives, swelling of the face, lips, tongue, or throat Low blood pressure--dizziness, feeling faint or lightheaded, blurry vision Shortness of breath Side effects that usually do not require medical attention (report to your care team if they continue or are bothersome): Flushing Headache Joint pain Muscle pain Nausea Pain, redness, or irritation at injection site This list may not describe all possible side effects. Call your doctor for medical advice about side effects. You may report side effects to FDA at 1-800-FDA-1088. Where should I keep my medication? This medication is given in a hospital or clinic. It will not be stored at home. NOTE: This sheet is a summary. It may not cover all possible information. If you have questions about this medicine, talk to your doctor, pharmacist, or health care provider.  2024 Elsevier/Gold Standard (2023-05-10 00:00:00)

## 2023-09-13 ENCOUNTER — Encounter: Payer: Self-pay | Admitting: Hematology and Oncology

## 2023-09-13 MED FILL — Iron Sucrose Inj 20 MG/ML (Fe Equiv): INTRAVENOUS | Qty: 10 | Status: AC

## 2023-09-16 ENCOUNTER — Inpatient Hospital Stay: Payer: Medicaid Other

## 2023-09-16 VITALS — BP 104/63 | HR 63 | Temp 98.0°F | Resp 18

## 2023-09-16 DIAGNOSIS — D509 Iron deficiency anemia, unspecified: Secondary | ICD-10-CM | POA: Diagnosis not present

## 2023-09-16 DIAGNOSIS — D500 Iron deficiency anemia secondary to blood loss (chronic): Secondary | ICD-10-CM

## 2023-09-16 MED ORDER — ACETAMINOPHEN 325 MG PO TABS
650.0000 mg | ORAL_TABLET | Freq: Once | ORAL | Status: AC
  Administered 2023-09-16: 650 mg via ORAL
  Filled 2023-09-16: qty 2

## 2023-09-16 MED ORDER — SODIUM CHLORIDE 0.9 % IV SOLN: Freq: Once | INTRAVENOUS | Status: AC

## 2023-09-16 MED ORDER — SODIUM CHLORIDE 0.9 % IV SOLN
200.0000 mg | Freq: Once | INTRAVENOUS | Status: AC
  Administered 2023-09-16: 200 mg via INTRAVENOUS
  Filled 2023-09-16: qty 200

## 2023-09-16 MED ORDER — LORATADINE 10 MG PO TABS
10.0000 mg | ORAL_TABLET | Freq: Once | ORAL | Status: AC
  Administered 2023-09-16: 10 mg via ORAL
  Filled 2023-09-16: qty 1

## 2023-09-16 NOTE — Patient Instructions (Signed)
Iron Sucrose Injection What is this medication? IRON SUCROSE (EYE ern SOO krose) treats low levels of iron (iron deficiency anemia) in people with kidney disease. Iron is a mineral that plays an important role in making red blood cells, which carry oxygen from your lungs to the rest of your body. This medicine may be used for other purposes; ask your health care provider or pharmacist if you have questions. COMMON BRAND NAME(S): Venofer What should I tell my care team before I take this medication? They need to know if you have any of these conditions: Anemia not caused by low iron levels Heart disease High levels of iron in the blood Kidney disease Liver disease An unusual or allergic reaction to iron, other medications, foods, dyes, or preservatives Pregnant or trying to get pregnant Breastfeeding How should I use this medication? This medication is for infusion into a vein. It is given in a hospital or clinic setting. Talk to your care team about the use of this medication in children. While this medication may be prescribed for children as young as 2 years for selected conditions, precautions do apply. Overdosage: If you think you have taken too much of this medicine contact a poison control center or emergency room at once. NOTE: This medicine is only for you. Do not share this medicine with others. What if I miss a dose? Keep appointments for follow-up doses. It is important not to miss your dose. Call your care team if you are unable to keep an appointment. What may interact with this medication? Do not take this medication with any of the following: Deferoxamine Dimercaprol Other iron products This medication may also interact with the following: Chloramphenicol Deferasirox This list may not describe all possible interactions. Give your health care provider a list of all the medicines, herbs, non-prescription drugs, or dietary supplements you use. Also tell them if you smoke,  drink alcohol, or use illegal drugs. Some items may interact with your medicine. What should I watch for while using this medication? Visit your care team regularly. Tell your care team if your symptoms do not start to get better or if they get worse. You may need blood work done while you are taking this medication. You may need to follow a special diet. Talk to your care team. Foods that contain iron include: whole grains/cereals, dried fruits, beans, or peas, leafy green vegetables, and organ meats (liver, kidney). What side effects may I notice from receiving this medication? Side effects that you should report to your care team as soon as possible: Allergic reactions--skin rash, itching, hives, swelling of the face, lips, tongue, or throat Low blood pressure--dizziness, feeling faint or lightheaded, blurry vision Shortness of breath Side effects that usually do not require medical attention (report to your care team if they continue or are bothersome): Flushing Headache Joint pain Muscle pain Nausea Pain, redness, or irritation at injection site This list may not describe all possible side effects. Call your doctor for medical advice about side effects. You may report side effects to FDA at 1-800-FDA-1088. Where should I keep my medication? This medication is given in a hospital or clinic. It will not be stored at home. NOTE: This sheet is a summary. It may not cover all possible information. If you have questions about this medicine, talk to your doctor, pharmacist, or health care provider.  2024 Elsevier/Gold Standard (2023-05-10 00:00:00)

## 2023-09-18 ENCOUNTER — Encounter: Payer: Self-pay | Admitting: Hematology and Oncology

## 2023-09-18 MED FILL — Iron Sucrose Inj 20 MG/ML (Fe Equiv): INTRAVENOUS | Qty: 10 | Status: AC

## 2023-09-19 ENCOUNTER — Inpatient Hospital Stay: Payer: Medicaid Other | Attending: Hematology and Oncology

## 2023-09-19 VITALS — BP 100/68 | HR 84 | Temp 98.4°F | Resp 18 | Wt 166.0 lb

## 2023-09-19 DIAGNOSIS — F17210 Nicotine dependence, cigarettes, uncomplicated: Secondary | ICD-10-CM | POA: Diagnosis not present

## 2023-09-19 DIAGNOSIS — Z8619 Personal history of other infectious and parasitic diseases: Secondary | ICD-10-CM | POA: Insufficient documentation

## 2023-09-19 DIAGNOSIS — D500 Iron deficiency anemia secondary to blood loss (chronic): Secondary | ICD-10-CM

## 2023-09-19 DIAGNOSIS — N920 Excessive and frequent menstruation with regular cycle: Secondary | ICD-10-CM | POA: Insufficient documentation

## 2023-09-19 DIAGNOSIS — Z79899 Other long term (current) drug therapy: Secondary | ICD-10-CM | POA: Insufficient documentation

## 2023-09-19 DIAGNOSIS — D509 Iron deficiency anemia, unspecified: Secondary | ICD-10-CM | POA: Diagnosis present

## 2023-09-19 DIAGNOSIS — E538 Deficiency of other specified B group vitamins: Secondary | ICD-10-CM | POA: Diagnosis not present

## 2023-09-19 DIAGNOSIS — Z9884 Bariatric surgery status: Secondary | ICD-10-CM | POA: Insufficient documentation

## 2023-09-19 MED ORDER — SODIUM CHLORIDE 0.9 % IV SOLN: Freq: Once | INTRAVENOUS | Status: AC

## 2023-09-19 MED ORDER — SODIUM CHLORIDE 0.9 % IV SOLN
200.0000 mg | Freq: Once | INTRAVENOUS | Status: AC
  Administered 2023-09-19: 200 mg via INTRAVENOUS
  Filled 2023-09-19: qty 200

## 2023-09-19 MED ORDER — ACETAMINOPHEN 325 MG PO TABS
650.0000 mg | ORAL_TABLET | Freq: Once | ORAL | Status: AC
  Administered 2023-09-19: 650 mg via ORAL
  Filled 2023-09-19: qty 2

## 2023-09-19 MED ORDER — LORATADINE 10 MG PO TABS
10.0000 mg | ORAL_TABLET | Freq: Once | ORAL | Status: AC
  Administered 2023-09-19: 10 mg via ORAL
  Filled 2023-09-19: qty 1

## 2023-11-14 ENCOUNTER — Other Ambulatory Visit: Payer: Self-pay | Admitting: Hematology and Oncology

## 2023-11-14 DIAGNOSIS — E538 Deficiency of other specified B group vitamins: Secondary | ICD-10-CM

## 2023-11-18 ENCOUNTER — Encounter: Payer: Self-pay | Admitting: Hematology and Oncology

## 2023-12-04 ENCOUNTER — Other Ambulatory Visit: Payer: Self-pay | Admitting: Hematology and Oncology

## 2023-12-04 DIAGNOSIS — D500 Iron deficiency anemia secondary to blood loss (chronic): Secondary | ICD-10-CM

## 2023-12-05 ENCOUNTER — Other Ambulatory Visit: Payer: Self-pay | Admitting: Hematology and Oncology

## 2023-12-05 ENCOUNTER — Inpatient Hospital Stay (HOSPITAL_BASED_OUTPATIENT_CLINIC_OR_DEPARTMENT_OTHER): Payer: Medicaid Other | Admitting: Hematology and Oncology

## 2023-12-05 ENCOUNTER — Encounter: Payer: Self-pay | Admitting: Hematology and Oncology

## 2023-12-05 ENCOUNTER — Telehealth: Payer: Self-pay

## 2023-12-05 ENCOUNTER — Inpatient Hospital Stay: Payer: Medicaid Other | Attending: Hematology and Oncology

## 2023-12-05 VITALS — BP 114/82 | HR 72 | Temp 97.5°F | Resp 18 | Ht 61.0 in | Wt 171.6 lb

## 2023-12-05 DIAGNOSIS — Z9884 Bariatric surgery status: Secondary | ICD-10-CM | POA: Insufficient documentation

## 2023-12-05 DIAGNOSIS — D509 Iron deficiency anemia, unspecified: Secondary | ICD-10-CM | POA: Diagnosis present

## 2023-12-05 DIAGNOSIS — F17210 Nicotine dependence, cigarettes, uncomplicated: Secondary | ICD-10-CM | POA: Diagnosis not present

## 2023-12-05 DIAGNOSIS — N920 Excessive and frequent menstruation with regular cycle: Secondary | ICD-10-CM | POA: Diagnosis not present

## 2023-12-05 DIAGNOSIS — E538 Deficiency of other specified B group vitamins: Secondary | ICD-10-CM

## 2023-12-05 DIAGNOSIS — Z8742 Personal history of other diseases of the female genital tract: Secondary | ICD-10-CM

## 2023-12-05 DIAGNOSIS — D500 Iron deficiency anemia secondary to blood loss (chronic): Secondary | ICD-10-CM

## 2023-12-05 DIAGNOSIS — Z8619 Personal history of other infectious and parasitic diseases: Secondary | ICD-10-CM | POA: Insufficient documentation

## 2023-12-05 DIAGNOSIS — Z79899 Other long term (current) drug therapy: Secondary | ICD-10-CM | POA: Diagnosis not present

## 2023-12-05 LAB — CBC WITH DIFFERENTIAL (CANCER CENTER ONLY)
Abs Immature Granulocytes: 0.02 10*3/uL (ref 0.00–0.07)
Basophils Absolute: 0 10*3/uL (ref 0.0–0.1)
Basophils Relative: 0 %
Eosinophils Absolute: 0.2 10*3/uL (ref 0.0–0.5)
Eosinophils Relative: 3 %
HCT: 40.2 % (ref 36.0–46.0)
Hemoglobin: 13.2 g/dL (ref 12.0–15.0)
Immature Granulocytes: 0 %
Lymphocytes Relative: 29 %
Lymphs Abs: 2.3 10*3/uL (ref 0.7–4.0)
MCH: 29.6 pg (ref 26.0–34.0)
MCHC: 32.8 g/dL (ref 30.0–36.0)
MCV: 90.1 fL (ref 80.0–100.0)
Monocytes Absolute: 0.3 10*3/uL (ref 0.1–1.0)
Monocytes Relative: 4 %
Neutro Abs: 5 10*3/uL (ref 1.7–7.7)
Neutrophils Relative %: 64 %
Platelet Count: 240 10*3/uL (ref 150–400)
RBC: 4.46 MIL/uL (ref 3.87–5.11)
RDW: 14.6 % (ref 11.5–15.5)
WBC Count: 7.9 10*3/uL (ref 4.0–10.5)
nRBC: 0 % (ref 0.0–0.2)
nRBC: 0 /100{WBCs}

## 2023-12-05 LAB — FOLATE: Folate: 6.7 ng/mL (ref >5.9)

## 2023-12-05 LAB — IRON AND TIBC
Iron: 77 ug/dL (ref 28–170)
Saturation Ratios: 21 % (ref 10.4–31.8)
TIBC: 361 ug/dL (ref 250–450)
UIBC: 284 ug/dL

## 2023-12-05 LAB — FERRITIN: Ferritin: 48 ng/mL (ref 11–307)

## 2023-12-05 NOTE — Progress Notes (Cosign Needed)
[facility]  [address] [place],  [place]  [address] [phone]  Clinic Day:  12/05/2023  Referring physician: Maris Berger, MD   HISTORY OF PRESENT ILLNESS:  The patient is a 47 y.o. female with felt to be related to menorrhagia and potentially decreased absorption due to her gastric sleeve who I began seeing in June.  She was also found to have folate deficiency, for which she is on folic acid 1000 mcg daily. She is here for repeat clinical assessment after having received IV iron again in September.  She continues to report very heavy menses every 4 weeks, lasting 1 to 2 weeks, with blood clots. She is not OCP, as she felt this wasn't effective, and timing was difficult for her. She is taking ferrous sulfate about every 2 days as it causes severe constipation.  S/P gastric sleeve in 2019. She has not seen GI for screening colonoscopy, will refer.  PHYSICAL EXAM:  Blood pressure 114/82, pulse 72, temperature (!) 97.5 F (36.4 C), temperature source Oral, resp. rate 18, height 5\' 1"  (1.549 m), weight 171 lb 9.6 oz (77.8 kg), SpO2 98%. Wt Readings from Last 3 Encounters:  12/05/23 171 lb 9.6 oz (77.8 kg)  09/19/23 166 lb (75.3 kg)  09/12/23 164 lb 1.9 oz (74.4 kg)   Body mass index is 32.42 kg/m.  Performance status (ECOG): 1 - Symptomatic but completely ambulatory  Physical Exam Vitals and nursing note reviewed.  Constitutional:      General: She is not in acute distress.    Appearance: Normal appearance.  HENT:     Head: Normocephalic and atraumatic.     Mouth/Throat:     Mouth: Mucous membranes are moist.     Pharynx: Oropharynx is clear. No oropharyngeal exudate or posterior oropharyngeal erythema.  Eyes:     General: No scleral icterus.    Extraocular Movements: Extraocular movements intact.     Conjunctiva/sclera: Conjunctivae normal.     Pupils: Pupils are equal, round, and reactive to light.  Cardiovascular:     Rate and Rhythm: Normal  rate and regular rhythm.     Heart sounds: Normal heart sounds. No murmur heard.    No friction rub. No gallop.  Pulmonary:     Effort: Pulmonary effort is normal.     Breath sounds: Normal breath sounds. No wheezing, rhonchi or rales.  Abdominal:     General: There is no distension.     Palpations: Abdomen is soft. There is no hepatomegaly, splenomegaly or mass.     Tenderness: There is no abdominal tenderness.  Musculoskeletal:        General: Normal range of motion.     Cervical back: Normal range of motion and neck supple. No tenderness.     Right lower leg: No edema.     Left lower leg: No edema.  Lymphadenopathy:     Cervical: No cervical adenopathy.     Upper Body:     Right upper body: No supraclavicular or axillary adenopathy.     Left upper body: No supraclavicular or axillary adenopathy.     Lower Body: No right inguinal adenopathy. No left inguinal adenopathy.  Skin:    General: Skin is warm and dry.     Coloration: Skin is not jaundiced.     Findings: No rash.  Neurological:     Mental Status: She is alert and oriented to person, place, and time.     Cranial Nerves: No cranial nerve deficit.  Psychiatric:        Mood and Affect: Mood normal.        Behavior: Behavior normal.        Thought Content: Thought content normal.     LABS:      Latest Ref Rng & Units 12/05/2023   10:47 AM 09/04/2023    8:44 AM 07/11/2023    8:41 AM  CBC  WBC 4.0 - 10.5 K/uL 7.9  6.7  7.0   Hemoglobin 12.0 - 15.0 g/dL 16.1  09.6  04.5   Hematocrit 36.0 - 46.0 % 40.2  38.2  39.0   Platelets 150 - 400 K/uL 240  280  257        No data to display          Lab Results  Component Value Date   TIBC 361 12/05/2023   TIBC 399 09/04/2023   FERRITIN 48 12/05/2023   FERRITIN 10 (L) 09/04/2023   FERRITIN 66 07/11/2023   IRONPCTSAT 21 12/05/2023   IRONPCTSAT 9 (L) 09/04/2023   Lab Results  Component Value Date   LDH 115 05/23/2023       Component Value Date/Time   LDH 115  05/23/2023 1538    Review Flowsheet  More data exists      Latest Ref Rng & Units 07/11/2023 09/04/2023 12/05/2023  Oncology Labs  Ferritin 11 - 307 ng/mL 66  10  48   %SAT 10.4 - 31.8 % - 9  21      STUDIES:  No results found.    ASSESSMENT & PLAN:   Assessment/Plan:  47 y.o. female with iron deficiency anemia felt to be due to menorrhagia.  Her hemoglobin is normal today and iron stores adequate. I told her we could focus on replacing her iron via IV as oral iron is so constipating. She still has not seen GI for a screening colonoscopy. I will refer her. I will plan to see her back in 3 months for repeat clinical assessment.  The patient understands all the plans discussed today and is in agreement with them.  She knows to contact our office if she develops concerns prior to her next appointment.     Adah Perl, PA-C   Physician Assistant [facility] [place] [phone]

## 2023-12-05 NOTE — Telephone Encounter (Signed)
-----   Message from Adah Perl sent at 12/05/2023  2:27 PM EST ----- Please let her know her iron stores are good today, so she does not need IV iron. I would like her to take a multivitamin with iron if tolerated. Thanks

## 2023-12-05 NOTE — Telephone Encounter (Signed)
Patient notified and voiced understanding.

## 2023-12-09 ENCOUNTER — Encounter: Payer: Self-pay | Admitting: Hematology and Oncology

## 2023-12-12 ENCOUNTER — Telehealth: Payer: Self-pay

## 2023-12-12 NOTE — Telephone Encounter (Signed)
Referral faxed

## 2023-12-12 NOTE — Telephone Encounter (Signed)
-----   Message from Adah Perl sent at 12/05/2023 11:41 AM EST ----- Please refer to [facility] for 2nd opinion for menorrhagia. Thank you

## 2023-12-12 NOTE — Telephone Encounter (Signed)
-----   Message from Adah Perl sent at 12/05/2023 11:22 AM EST ----- Please refer to Dr. Jennye Boroughs for GI evaluation of iron deficiency.

## 2024-02-06 ENCOUNTER — Ambulatory Visit: Payer: Medicaid Other | Admitting: Nurse Practitioner

## 2024-02-12 ENCOUNTER — Ambulatory Visit: Payer: Medicaid Other | Admitting: Nurse Practitioner

## 2024-02-12 VITALS — BP 110/74 | HR 64 | Ht 61.0 in | Wt 166.0 lb

## 2024-02-12 DIAGNOSIS — N920 Excessive and frequent menstruation with regular cycle: Secondary | ICD-10-CM

## 2024-02-12 DIAGNOSIS — D500 Iron deficiency anemia secondary to blood loss (chronic): Secondary | ICD-10-CM

## 2024-02-12 DIAGNOSIS — N939 Abnormal uterine and vaginal bleeding, unspecified: Secondary | ICD-10-CM

## 2024-02-12 MED ORDER — MEGESTROL ACETATE 40 MG PO TABS: 40.0000 mg | ORAL_TABLET | Freq: Two times a day (BID) | ORAL | 1 refills | Status: DC

## 2024-02-12 NOTE — Progress Notes (Signed)
   Acute Office Visit  Subjective:    Patient ID: Heidi [name], female    DOB: August 03, 1976, 48 y.o.   MRN: [id number]   HPI 48 y.o. G2P1001 presents as new patient for menorrhagia. Referral provided by hematology. Reports heavy periods since daughter was born 26 years ago. Feels she has not been heard over the years and has failed multiple treatments including OCPs, IUD (had Mirena for 2 years with no improvement), suppressive meds like Provera. H/O anemia s/t heavy periods and decreased absorption d/t gastric sleeve surgery. Receiving iron transfusions with hematology, last infusion 09/2023. Normal hgb and iron panel 11/2023. Cycles are monthly, lasting 1-2 weeks with blood clots, changing pads every 1-2 hours on heavy days. See ultrasound from March 2024 and EMB 03/2023 below. Normal pap 03/27/23. Not sexually active. Wants hysterectomy.   U/S 02/23/23 - IMPRESSION:  1. Retroflexed uterus with 1.6 centimeter subserosal fibroid.  2. Heterogeneous mass in the RIGHT adnexal region, likely  representing hemorrhagic cyst. Endometrioma or other lesion could  have a similar appearance. Recommend follow-up pelvic ultrasound in  8-12 weeks.   EMB 03/27/23 - Very scanty and minute fragments of detached endometrial glands showing tubal metaplasia and features suggestive of stromal breakdown. - Suboptimal sampling for definitive diagnosis. Patient declined re-sampling at that time  04/2023 Hysteroscopy - benign endo polyp  Patient's last menstrual period was 02/01/2024. Period Duration (Days): 15 Period Pattern: (!) Irregular Menstrual Flow: Heavy Menstrual Control: Tampon Menstrual Control Change Freq (Hours): Dysmenorrhea: (!) Severe Dysmenorrhea Symptoms: Cramping, Headache  Review of Systems  Constitutional: Negative.   Genitourinary:  Positive for menstrual problem.       Objective:    Physical Exam Constitutional:      Appearance: Normal appearance.  Genitourinary:     General: Normal vulva.     Vagina: Normal.     Cervix: Normal.     Uterus: Normal.      Adnexa: Right adnexa normal and left adnexa normal.     BP 110/74   Pulse 64   Ht 5\' 1"  (1.549 m)   Wt 166 lb (75.3 kg)   LMP 02/01/2024   SpO2 100%   BMI 31.37 kg/m  Wt Readings from Last 3 Encounters:  02/12/24 166 lb (75.3 kg)  12/05/23 171 lb 9.6 oz (77.8 kg)  09/19/23 166 lb (75.3 kg)        Patient informed chaperone available to be present for breast and/or pelvic exam. Patient has requested no chaperone to be present. Patient has been advised what will be completed during breast and pelvic exam.   Assessment & Plan:   Problem List Items Addressed This Visit       Other   Iron deficiency anemia due to chronic blood loss   Other Visit Diagnoses       Abnormal uterine bleeding (AUB)    -  Primary   Relevant Medications   megestrol (MEGACE) 40 MG tablet     Menorrhagia with regular cycle          Plan: Reviewed history and prior imaging/tests. Has failed multiple treatments. Wants hysterectomy and I am agreeable. Will schedule consult for surgery. Megace provided to take BID x 10 days if bleeding lasts more than 7 days.     Olivia Mackie DNP, 9:40 AM 02/12/2024

## 2024-02-19 ENCOUNTER — Encounter: Payer: Self-pay | Admitting: Obstetrics and Gynecology

## 2024-02-19 ENCOUNTER — Ambulatory Visit: Payer: Medicaid Other | Admitting: Obstetrics and Gynecology

## 2024-02-19 VITALS — BP 118/72 | HR 78 | Temp 98.0°F | Ht 60.24 in | Wt 167.0 lb

## 2024-02-19 DIAGNOSIS — N83201 Unspecified ovarian cyst, right side: Secondary | ICD-10-CM | POA: Diagnosis not present

## 2024-02-19 DIAGNOSIS — N939 Abnormal uterine and vaginal bleeding, unspecified: Secondary | ICD-10-CM

## 2024-02-19 DIAGNOSIS — D500 Iron deficiency anemia secondary to blood loss (chronic): Secondary | ICD-10-CM | POA: Diagnosis not present

## 2024-02-19 NOTE — Progress Notes (Signed)
 48 y.o. G75P3003 female with hepatitis C (negative viral load 07/03/2023), AUB, right ovarian cyst here for surgical consultation. Single, relationship x8 years. Children 7-26yo. 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  Patient's last menstrual period was 02/01/2024 (exact date). Period Duration (Days): 15 Period Pattern: Regular Menstrual Flow: Heavy Dysmenorrhea: (!) Severe Dysmenorrhea Symptoms: Headache  Was seeing a GYN in [place]. Referred by hematology for AUB. On Megace for AUB. 12/05/2023 hemoglobin 13.2 03/27/2023 Pap N IL 02/19/2023 TVUS uterus 8.9 x 5.3 x 6.4 cm, retroflexed with less than 2 cm subserosal fibroid of the right uterus.  Right ovary with 4.6 cm complex cyst.  Still having HVB with megace 40mg  BID.  EMB 03/27/23 - Very scanty and minute fragments of detached endometrial glands showing tubal metaplasia and features suggestive of stromal breakdown. - Suboptimal sampling for definitive diagnosis. ( See care everywhere) Patient declined re-sampling at that time   04/2023 Hysteroscopy - benign endo polyp (CE, see 05/29/23 note)  Seen 02/12/24 and started on megace BID, patient has rx but has not started it yet. Denies CP and SOB. Done with childbearing Desires hysterectomy  Birth control: none Sexually active: yes   GYN HISTORY: Hysteroscopy, 2024  OB History  Gravida Para Term Preterm AB Living  3 3 3  0 0 3  SAB IAB Ectopic Multiple Live Births  0 0 0 0 3    # Outcome Date GA Lbr Len/2nd Weight Sex Type Anes PTL Lv  3 Term      Vag-Spont   LIV  2 Term      Vag-Spont   LIV  1 Term      Vag-Spont   LIV    Past Medical History:  Diagnosis Date   Anemia    Anxiety    Depression    Hepatitis C    Medical history non-contributory     Past Surgical History:  Procedure Laterality Date   CHOLECYSTECTOMY, LAPAROSCOPIC     LAPAROSCOPIC GASTRIC SLEEVE RESECTION     LAPAROSCOPIC GASTRIC SLEEVE RESECTION  2016   LAPAROSCOPIC OVARIAN CYSTECTOMY  2003     Current Outpatient Medications on File Prior to Visit  Medication Sig Dispense Refill   folic acid (FOLVITE) 1 MG tablet TAKE 1 TABLET(1 MG) BY MOUTH DAILY 90 tablet 1   IRON, FERROUS SULFATE, PO Take 1 tablet by mouth 2 (two) times daily. 325mg      megestrol (MEGACE) 40 MG tablet Take 1 tablet (40 mg total) by mouth 2 (two) times daily. 20 tablet 1   Prenatal Vit-Fe Fumarate-FA (PRENATAL MULTIVITAMIN) TABS tablet Take 1 tablet by mouth daily at 12 noon.     sertraline (ZOLOFT) 50 MG tablet Take 1 tablet by mouth at bedtime. 1 to 1/2 tabs q hs     No current facility-administered medications on file prior to visit.    No Known Allergies    PE Today's Vitals   02/19/24 1227  BP: 118/72  Pulse: 78  Temp: 98 F (36.7 C)  TempSrc: Oral  SpO2: 98%  Weight: 167 lb (75.8 kg)  Height: 5' 0.24" (1.53 m)   Body mass index is 32.36 kg/m.  Physical Exam Vitals reviewed.  Constitutional:      General: She is not in acute distress.    Appearance: Normal appearance.  HENT:     Head: Normocephalic and atraumatic.     Nose: Nose normal.  Eyes:     Extraocular Movements: Extraocular movements intact.     Conjunctiva/sclera: Conjunctivae normal.  Pulmonary:     Effort: Pulmonary effort is normal.  Musculoskeletal:        General: Normal range of motion.     Cervical back: Normal range of motion.  Neurological:     General: No focal deficit present.     Mental Status: She is alert.  Psychiatric:        Mood and Affect: Mood normal.        Behavior: Behavior normal.       Assessment and Plan:        Abnormal uterine bleeding (AUB) -     Ambulatory Referral For Surgery Scheduling  Right ovarian cyst -     Ambulatory Referral For Surgery Scheduling  Iron deficiency anemia due to chronic blood loss    Plan for robotic assisted total laparoscopic hysterectomy and bilateral salpingectomy, possible right ovarian cystectomy. Discussed outpatient procedure. Reviewed that   recovery is usually 6 weeks with additional 4 weeks of pelvic rest. Risks including infections, bleeding, and damage to surrounding organs reviewed. Patient in agreement with initial plan. Orders placed for surgery. RTO for preop visit with [place]   Rosalyn Gess, MD

## 2024-03-03 ENCOUNTER — Ambulatory Visit: Admitting: Obstetrics and Gynecology

## 2024-03-03 ENCOUNTER — Ambulatory Visit (INDEPENDENT_AMBULATORY_CARE_PROVIDER_SITE_OTHER)

## 2024-03-03 ENCOUNTER — Encounter: Payer: Self-pay | Admitting: Obstetrics and Gynecology

## 2024-03-03 VITALS — BP 122/76 | HR 63 | Temp 98.2°F | Wt 168.0 lb

## 2024-03-03 DIAGNOSIS — N939 Abnormal uterine and vaginal bleeding, unspecified: Secondary | ICD-10-CM | POA: Diagnosis not present

## 2024-03-03 DIAGNOSIS — N83201 Unspecified ovarian cyst, right side: Secondary | ICD-10-CM

## 2024-03-03 NOTE — Progress Notes (Signed)
 48 y.o. G36P3003 female with hepatitis C (negative viral load 07/03/2023, completed treatment 2024, follows with Dr. Elsie Stain at [facility]), AUB, right ovarian cyst (resolved) here for preoperative exam and transvaginal ultrasound. Single, relationship x8 years. Children 7-26yo. 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 Surgical history notable for lap sleeve gastrectomy, lap chole, and lap ovarian cystectomy.  Patient's last menstrual period was 02/01/2024 (exact date).   Was seeing a GYN in [place]. Referred by hematology for AUB. On Megace for AUB. Still having HVB with megace 40mg  BID. 12/05/2023 hemoglobin 13.2 03/27/2023 Pap N IL 02/19/2023 TVUS uterus 8.9 x 5.3 x 6.4 cm, retroflexed with less than 2 cm subserosal fibroid of the right uterus.  Right ovary with 4.6 cm complex cyst.   EMB 03/27/23 - Very scanty and minute fragments of detached endometrial glands showing tubal metaplasia and features suggestive of stromal breakdown. - Suboptimal sampling for definitive diagnosis. ( See care everywhere) Patient declined re-sampling at that time   04/2023 Hysteroscopy - benign endo polyp (CE, see 05/29/23 note)  Seen 02/12/24 and started on megace BID, patient has rx but has not started it yet. Denies CP and SOB. Done with childbearing Desires hysterectomy  Birth control: none Sexually active: yes   GYN HISTORY: Hysteroscopy, 2024 Prior cystectomy, unsure which side  OB History  Gravida Para Term Preterm AB Living  3 3 3  0 0 3  SAB IAB Ectopic Multiple Live Births  0 0 0 0 3    # Outcome Date GA Lbr Len/2nd Weight Sex Type Anes PTL Lv  3 Term      Vag-Spont   LIV  2 Term      Vag-Spont   LIV  1 Term      Vag-Spont   LIV    Past Medical History:  Diagnosis Date   Anemia    Anxiety    Depression    Hepatitis C    Medical history non-contributory     Past Surgical History:  Procedure Laterality Date   CHOLECYSTECTOMY, LAPAROSCOPIC     LAPAROSCOPIC GASTRIC SLEEVE RESECTION      LAPAROSCOPIC GASTRIC SLEEVE RESECTION  2016   LAPAROSCOPIC OVARIAN CYSTECTOMY  2003    Current Outpatient Medications on File Prior to Visit  Medication Sig Dispense Refill   folic acid (FOLVITE) 1 MG tablet TAKE 1 TABLET(1 MG) BY MOUTH DAILY 90 tablet 1   IRON, FERROUS SULFATE, PO Take 1 tablet by mouth 2 (two) times daily. 325mg      megestrol (MEGACE) 40 MG tablet Take 1 tablet (40 mg total) by mouth 2 (two) times daily. 20 tablet 1   Prenatal Vit-Fe Fumarate-FA (PRENATAL MULTIVITAMIN) TABS tablet Take 1 tablet by mouth daily at 12 noon.     No current facility-administered medications on file prior to visit.    No Known Allergies    PE Today's Vitals   03/03/24 1401  BP: 122/76  Pulse: 63  Temp: 98.2 F (36.8 C)  TempSrc: Oral  SpO2: 99%  Weight: 168 lb (76.2 kg)   Body mass index is 32.55 kg/m.  Physical Exam Vitals reviewed.  Constitutional:      General: She is not in acute distress.    Appearance: Normal appearance.  HENT:     Head: Normocephalic and atraumatic.     Nose: Nose normal.  Eyes:     Extraocular Movements: Extraocular movements intact.     Conjunctiva/sclera: Conjunctivae normal.  Cardiovascular:     Rate and Rhythm: Normal rate and  regular rhythm.     Heart sounds: No murmur heard.    No friction rub. No gallop.  Pulmonary:     Effort: Pulmonary effort is normal. No respiratory distress.     Breath sounds: Normal breath sounds. No stridor. No wheezing, rhonchi or rales.  Musculoskeletal:        General: Normal range of motion.     Cervical back: Normal range of motion.  Neurological:     General: No focal deficit present.     Mental Status: She is alert.  Psychiatric:        Mood and Affect: Mood normal.        Behavior: Behavior normal.     03/10/24 TVUS:  Indications: AUB  Findings:   Uterus: 8.1 x 6.8 x 5.8 cm, retroverted uterus.  3 fibroids noted the largest measuring 2.0 cm. Endometrial thickness: 6 mm. Left ovary: 2.7 x  1.5 x 8.0 cm, normal-appearing. Right ovary: 2.6 x 1.8 x 2.0 cm, normal-appearing. No free fluid.  Impression:  Uterine fibroids noted.  Stable comparison to June 2024 TVUS. 2.  No evidence of right ovarian cyst.  Rosalyn Gess, MD    Assessment and Plan:        Abnormal uterine bleeding (AUB) Assessment & Plan: Reviewed TVUS with patient Plan for robotic assisted total laparoscopic hysterectomy and bilateral salpingectomy. Discussed outpatient procedure. Reviewed that  recovery is usually 6 weeks with additional 4 weeks of pelvic rest. Risks including infections, bleeding, and damage to surrounding organs reviewed. Agreeable to blood transfusion if indicated. Patient in agreement with initial plan.   Preop checklist: Antibiotics: Ancef DVT ppx: SCDs, lovenox Postop visit: 2, 6 week Additional clearance: none Tubal and hysterectomy forms completed today   Right ovarian cyst  Ovarian cyst resolved, no cystectomy needed    Rosalyn Gess, MD

## 2024-03-04 ENCOUNTER — Inpatient Hospital Stay: Payer: Medicaid Other | Admitting: Hematology and Oncology

## 2024-03-04 ENCOUNTER — Inpatient Hospital Stay: Admitting: Hematology and Oncology

## 2024-03-04 ENCOUNTER — Inpatient Hospital Stay: Attending: Hematology and Oncology

## 2024-03-04 ENCOUNTER — Inpatient Hospital Stay: Payer: Medicaid Other

## 2024-03-10 ENCOUNTER — Other Ambulatory Visit: Payer: Self-pay | Admitting: Hematology and Oncology

## 2024-03-10 DIAGNOSIS — N939 Abnormal uterine and vaginal bleeding, unspecified: Secondary | ICD-10-CM | POA: Insufficient documentation

## 2024-03-10 DIAGNOSIS — D500 Iron deficiency anemia secondary to blood loss (chronic): Secondary | ICD-10-CM

## 2024-03-10 NOTE — Assessment & Plan Note (Signed)
 Reviewed TVUS with patient Plan for robotic assisted total laparoscopic hysterectomy and bilateral salpingectomy. Discussed outpatient procedure. Reviewed that  recovery is usually 6 weeks with additional 4 weeks of pelvic rest. Risks including infections, bleeding, and damage to surrounding organs reviewed. Agreeable to blood transfusion if indicated. Patient in agreement with initial plan.   Preop checklist: Antibiotics: Ancef DVT ppx: SCDs, lovenox Postop visit: 2, 6 week Additional clearance: none Tubal and hysterectomy forms completed today

## 2024-03-11 ENCOUNTER — Inpatient Hospital Stay: Attending: Hematology and Oncology

## 2024-03-11 ENCOUNTER — Encounter: Payer: Self-pay | Admitting: Hematology and Oncology

## 2024-03-11 ENCOUNTER — Telehealth: Payer: Self-pay | Admitting: Hematology and Oncology

## 2024-03-11 ENCOUNTER — Telehealth: Payer: Self-pay

## 2024-03-11 ENCOUNTER — Inpatient Hospital Stay (HOSPITAL_BASED_OUTPATIENT_CLINIC_OR_DEPARTMENT_OTHER): Admitting: Hematology and Oncology

## 2024-03-11 VITALS — BP 126/82 | HR 65 | Temp 98.1°F | Resp 20 | Ht 60.24 in | Wt 169.3 lb

## 2024-03-11 DIAGNOSIS — Z79899 Other long term (current) drug therapy: Secondary | ICD-10-CM | POA: Diagnosis not present

## 2024-03-11 DIAGNOSIS — D500 Iron deficiency anemia secondary to blood loss (chronic): Secondary | ICD-10-CM

## 2024-03-11 DIAGNOSIS — E538 Deficiency of other specified B group vitamins: Secondary | ICD-10-CM | POA: Diagnosis not present

## 2024-03-11 DIAGNOSIS — R531 Weakness: Secondary | ICD-10-CM | POA: Insufficient documentation

## 2024-03-11 DIAGNOSIS — R5383 Other fatigue: Secondary | ICD-10-CM | POA: Insufficient documentation

## 2024-03-11 DIAGNOSIS — D509 Iron deficiency anemia, unspecified: Secondary | ICD-10-CM | POA: Diagnosis present

## 2024-03-11 DIAGNOSIS — Z8619 Personal history of other infectious and parasitic diseases: Secondary | ICD-10-CM | POA: Insufficient documentation

## 2024-03-11 DIAGNOSIS — Z9884 Bariatric surgery status: Secondary | ICD-10-CM | POA: Insufficient documentation

## 2024-03-11 DIAGNOSIS — N920 Excessive and frequent menstruation with regular cycle: Secondary | ICD-10-CM | POA: Diagnosis not present

## 2024-03-11 DIAGNOSIS — F17210 Nicotine dependence, cigarettes, uncomplicated: Secondary | ICD-10-CM | POA: Diagnosis not present

## 2024-03-11 LAB — CMP (CANCER CENTER ONLY)
ALT: <5 U/L (ref 0–44)
AST: 17 U/L (ref 15–41)
Albumin: 4.2 g/dL (ref 3.5–5.0)
Alkaline Phosphatase: 84 U/L (ref 38–126)
Anion gap: 11 (ref 5–15)
BUN: 12 mg/dL (ref 6–20)
CO2: 24 mmol/L (ref 22–32)
Calcium: 9.1 mg/dL (ref 8.9–10.3)
Chloride: 105 mmol/L (ref 98–111)
Creatinine: 0.71 mg/dL (ref 0.44–1.00)
GFR, Estimated: >60 mL/min (ref >60)
Glucose, Bld: 86 mg/dL (ref 70–99)
Potassium: 3.9 mmol/L (ref 3.5–5.1)
Sodium: 140 mmol/L (ref 135–145)
Total Bilirubin: <0.2 mg/dL (ref 0.0–1.2)
Total Protein: 7.4 g/dL (ref 6.5–8.1)

## 2024-03-11 LAB — CBC WITH DIFFERENTIAL (CANCER CENTER ONLY)
Abs Immature Granulocytes: 0.03 10*3/uL (ref 0.00–0.07)
Basophils Absolute: 0 10*3/uL (ref 0.0–0.1)
Basophils Relative: 0 %
Eosinophils Absolute: 0.3 10*3/uL (ref 0.0–0.5)
Eosinophils Relative: 3 %
HCT: 37.5 % (ref 36.0–46.0)
Hemoglobin: 12.2 g/dL (ref 12.0–15.0)
Immature Granulocytes: 0 %
Lymphocytes Relative: 32 %
Lymphs Abs: 2.9 10*3/uL (ref 0.7–4.0)
MCH: 29.9 pg (ref 26.0–34.0)
MCHC: 32.5 g/dL (ref 30.0–36.0)
MCV: 91.9 fL (ref 80.0–100.0)
Monocytes Absolute: 0.3 10*3/uL (ref 0.1–1.0)
Monocytes Relative: 4 %
Neutro Abs: 5.6 10*3/uL (ref 1.7–7.7)
Neutrophils Relative %: 61 %
Platelet Count: 306 10*3/uL (ref 150–400)
RBC: 4.08 MIL/uL (ref 3.87–5.11)
RDW: 13.9 % (ref 11.5–15.5)
WBC Count: 9.2 10*3/uL (ref 4.0–10.5)
nRBC: 0 % (ref 0.0–0.2)
nRBC: 0 /100{WBCs}

## 2024-03-11 LAB — IRON AND TIBC
Iron: 67 ug/dL (ref 28–170)
Saturation Ratios: 20 % (ref 10.4–31.8)
TIBC: 344 ug/dL (ref 250–450)
UIBC: 277 ug/dL

## 2024-03-11 LAB — FOLATE: Folate: 5.9 ng/mL — ABNORMAL LOW (ref >5.9)

## 2024-03-11 LAB — FERRITIN: Ferritin: 43 ng/mL (ref 11–307)

## 2024-03-11 LAB — VITAMIN B12: Vitamin B-12: 261 pg/mL (ref 180–914)

## 2024-03-11 NOTE — Progress Notes (Cosign Needed)
 [facility]  [address] [place],  [place]  [address] [phone]  Clinic Day:  03/11/2024  Referring physician: Maris Berger, MD   HISTORY OF PRESENT ILLNESS:  The patient is a 48 y.o. female with iron deficiency anemia, which is felt to be related to menorrhagia and potentially decreased absorption due to her gastric sleeve. She received IV iron in June and again in September 2024.  She was also found to have folate deficiency, for which she is on folic acid 1000 mcg daily.  Evaluation did not reveal evidence of bleeding disorder contributing to her heavy menses.  She is here for repeat clinical assessment.  She reports persistent fatigue and weakness.  She denies pica for ice.  She continues to report heavy menses and is menstruating at this time.  She denies other overt form of blood loss.  She is scheduled for hysterectomy in early May.  She has not heard from GI regarding screening colonoscopy.  PHYSICAL EXAM:  Blood pressure 126/82, pulse 65, temperature 98.1 F (36.7 C), temperature source Oral, resp. rate 20, height 5' 0.24" (1.53 m), weight 169 lb 4.8 oz (76.8 kg), last menstrual period 03/11/2024, SpO2 100%. Wt Readings from Last 3 Encounters:  03/11/24 169 lb 4.8 oz (76.8 kg)  03/03/24 168 lb (76.2 kg)  02/19/24 167 lb (75.8 kg)   Body mass index is 32.8 kg/m.  Performance status (ECOG): 1 - Symptomatic but completely ambulatory  Physical Exam Vitals and nursing note reviewed.  Constitutional:      General: She is not in acute distress.    Appearance: Normal appearance. She is obese. She is not ill-appearing.  HENT:     Head: Normocephalic and atraumatic.     Mouth/Throat:     Mouth: Mucous membranes are moist.     Pharynx: Oropharynx is clear. No oropharyngeal exudate or posterior oropharyngeal erythema.  Eyes:     General: No scleral icterus.    Extraocular Movements: Extraocular movements intact.     Conjunctiva/sclera: Conjunctivae  normal.     Pupils: Pupils are equal, round, and reactive to light.  Cardiovascular:     Rate and Rhythm: Normal rate and regular rhythm.     Heart sounds: Normal heart sounds. No murmur heard.    No friction rub. No gallop.  Pulmonary:     Effort: Pulmonary effort is normal.     Breath sounds: Normal breath sounds. No wheezing, rhonchi or rales.  Abdominal:     General: There is no distension.     Palpations: Abdomen is soft. There is no hepatomegaly, splenomegaly or mass.     Tenderness: There is no abdominal tenderness.  Musculoskeletal:        General: Normal range of motion.     Cervical back: Normal range of motion and neck supple. No tenderness.     Right lower leg: No edema.     Left lower leg: No edema.  Lymphadenopathy:     Cervical: No cervical adenopathy.     Upper Body:     Right upper body: No supraclavicular or axillary adenopathy.     Left upper body: No supraclavicular or axillary adenopathy.     Lower Body: No right inguinal adenopathy. No left inguinal adenopathy.  Skin:    General: Skin is warm and dry.     Coloration: Skin is not jaundiced.     Findings: No rash.  Neurological:     Mental Status: She is alert and oriented to  person, place, and time.     Cranial Nerves: No cranial nerve deficit.  Psychiatric:        Mood and Affect: Mood normal.        Behavior: Behavior normal.        Thought Content: Thought content normal.     LABS:      Latest Ref Rng & Units 03/11/2024    1:24 PM 12/05/2023   10:47 AM 09/04/2023    8:44 AM  CBC  WBC 4.0 - 10.5 K/uL 9.2  7.9  6.7   Hemoglobin 12.0 - 15.0 g/dL 13.2  44.0  10.2   Hematocrit 36.0 - 46.0 % 37.5  40.2  38.2   Platelets 150 - 400 K/uL 306  240  280       Latest Ref Rng & Units 03/11/2024    1:24 PM  CMP  Glucose 70 - 99 mg/dL 86   BUN 6 - 20 mg/dL 12   Creatinine 7.25 - 1.00 mg/dL 3.66   Sodium 440 - 347 mmol/L 140   Potassium 3.5 - 5.1 mmol/L 3.9   Chloride 98 - 111 mmol/L 105   CO2 22 - 32  mmol/L 24   Calcium 8.9 - 10.3 mg/dL 9.1   Total Protein 6.5 - 8.1 g/dL 7.4   Total Bilirubin 0.0 - 1.2 mg/dL <4.2   Alkaline Phos 38 - 126 U/L 84   AST 15 - 41 U/L 17   ALT 0 - 44 U/L <5     Lab Results  Component Value Date   TIBC 361 12/05/2023   TIBC 399 09/04/2023   FERRITIN 48 12/05/2023   FERRITIN 10 (L) 09/04/2023   FERRITIN 66 07/11/2023   IRONPCTSAT 21 12/05/2023   IRONPCTSAT 9 (L) 09/04/2023   Lab Results  Component Value Date   LDH 115 05/23/2023       Component Value Date/Time   LDH 115 05/23/2023 1538    Review Flowsheet  More data exists      Latest Ref Rng & Units 07/11/2023 09/04/2023 12/05/2023  Oncology Labs  Ferritin 11 - 307 ng/mL 66  10  48   %SAT 10.4 - 31.8 % - 9  21      STUDIES:  US PELVIS TRANSVAGINAL NON-OB (TV ONLY) Result Date: 03/03/2024 Indications: AUB Findings: Uterus: 8.1 x 6.8 x 5.8 cm, retroverted uterus.  3 fibroids noted the largest measuring 2.0 cm. Endometrial thickness: 6 mm. Left ovary: 2.7 x 1.5 x 8.0 cm, normal-appearing. Right ovary: 2.6 x 1.8 x 2.0 cm, normal-appearing. No free fluid. Impression: Uterine fibroids noted.  Stable comparison to June 2024 TVUS. 2.  No evidence of right ovarian cyst. Rosalyn Gess, MD     ASSESSMENT & PLAN:   Assessment/Plan:  48 y.o. female with iron deficiency anemia felt to be due to menorrhagia.  She also had folate deficiency, for which she is on folic acid daily.  Her hemoglobin mains normal.  Iron studies are pending from today.  If need be, she will receive IV iron. We will contact GI to check on the referral.  She is scheduled for hysterectomy in May.  I will plan to see her back in 3 months for repeat clinical assessment.  The patient understands all the plans discussed today and is in agreement with them.  She knows to contact our office if she develops concerns prior to her next appointment.     Adah Perl, PA-C   Physician Assistant [facility]  [place] [phone]

## 2024-03-11 NOTE — Telephone Encounter (Signed)
 Patient voiced she had never heard back on GI referral. Patient had sch appt with Dr Gerilyn Pilgrim office for 03/02/2024 and no showed. Patient given there number to call and resch appt.

## 2024-03-11 NOTE — Telephone Encounter (Signed)
 03/11/24 Spoke with patient and confirmed next appt in .

## 2024-03-12 ENCOUNTER — Telehealth: Payer: Self-pay

## 2024-03-12 ENCOUNTER — Encounter: Payer: Self-pay | Admitting: Hematology and Oncology

## 2024-03-12 NOTE — Telephone Encounter (Signed)
-----   Message from Adah Perl sent at 03/12/2024  6:08 AM EDT ----- Please let her know her folic acid is borderline low, so have her take OTC folic acid 400 international units daily. Thanks

## 2024-03-12 NOTE — Telephone Encounter (Signed)
 Patient notified and voiced understanding.

## 2024-03-14 ENCOUNTER — Encounter: Payer: Self-pay | Admitting: Hematology and Oncology

## 2024-03-25 ENCOUNTER — Encounter: Payer: Self-pay | Admitting: Obstetrics and Gynecology

## 2024-03-25 ENCOUNTER — Ambulatory Visit: Admitting: Obstetrics and Gynecology

## 2024-03-25 VITALS — BP 122/80 | HR 66 | Temp 98.1°F | Ht 60.63 in | Wt 163.0 lb

## 2024-03-25 DIAGNOSIS — Z01818 Encounter for other preprocedural examination: Secondary | ICD-10-CM

## 2024-03-25 DIAGNOSIS — N939 Abnormal uterine and vaginal bleeding, unspecified: Secondary | ICD-10-CM

## 2024-03-25 MED ORDER — ACETAMINOPHEN 500 MG PO TABS: 1000.0000 mg | ORAL_TABLET | Freq: Three times a day (TID) | ORAL | 0 refills | Status: DC | PRN

## 2024-03-25 MED ORDER — IBUPROFEN 800 MG PO TABS: 800.0000 mg | ORAL_TABLET | Freq: Three times a day (TID) | ORAL | 0 refills | Status: DC | PRN

## 2024-03-25 MED ORDER — TRAMADOL HCL 50 MG PO TABS: ORAL_TABLET | ORAL | 0 refills | Status: DC

## 2024-03-25 MED ORDER — MEGESTROL ACETATE 40 MG PO TABS: 40.0000 mg | ORAL_TABLET | Freq: Two times a day (BID) | ORAL | 1 refills | Status: DC

## 2024-03-25 NOTE — Assessment & Plan Note (Addendum)
 Patient presents for updated preoperative exam, previously completed 03/03/24 Patient has started [facility] this week and has run out of Megace. New Rx for Megace provided.  Plan for robotic assisted total laparoscopic hysterectomy and bilateral salpingectomy on 04/22/2024. Discussed outpatient procedure. Reviewed that  recovery is usually 6 weeks with additional 4 weeks of pelvic rest. Risks including infections, bleeding, and damage to surrounding organs reviewed. Agreeable to blood transfusion if indicated. Patient in agreement with initial plan.   Preop checklist: Antibiotics: Ancef DVT ppx: SCDs, lovenox Postop visit: 2, 6 week Additional clearance: none Tubal and hysterectomy forms completed previously Will send prescriptions for postop pain management today. Patient aware of need to discontinue Wegovy by 04/08/24.

## 2024-03-25 NOTE — Patient Instructions (Signed)
 Preoperative instructions: Nothing to eat or drink after midnight, unless instructed differently regarding clear liquids by the anesthesia team at [facility] health. Do not take any medications on the day of surgery, except those listed below: NONE STOP Wegovy 2 weeks prior to surgery. Please follow all other instructions as provided by our surgical scheduler at [facility] and the anesthesia team at [facility] health.  Postoperative instructions: Clean your incision daily with mild soapy water.  Sitz bath are helpful for wound healing.   Ensure that you thoroughly dry your wound following cleaning and keep dry throughout the day.  You can apply a thin layer of Aquaphor or Vaseline over your incision. Ice packs can be applied to your incision for up to 20 minutes at a time to help with pain and swelling. Take ibuprofen as prescribed and over-the-counter Tylenol as needed.

## 2024-03-25 NOTE — H&P (View-Only) (Signed)
 48 y.o. G65P3003 female with hepatitis C (negative viral load 07/03/2023, completed treatment 2024, follows with Dr. Elsie Stain at [facility]), AUB, here for preoperative exam: robotic assisted total laparoscopic hysterectomy and bilateral salpingectomy scheduled 04/22/24. Single, relationship x8 years. Children 7-26yo. 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  Surgical history notable for lap sleeve gastrectomy, lap chole, and lap ovarian cystectomy.  Patient's last menstrual period was 03/03/2024 (approximate).   Was seeing a GYN in [place]. Referred by hematology for AUB. On Megace for AUB. Still having HVB with megace 40mg  BID.  Work-up to date: 03/27/2023 Pap NIL  EMB 03/27/23 - Very scanty and minute fragments of detached endometrial glands showing tubal metaplasia and features suggestive of stromal breakdown. - Suboptimal sampling for definitive diagnosis. ( See care everywhere) Patient declined re-sampling at that time   04/2023 Hysteroscopy - benign endo polyp (CE, see 05/29/23 note) 12/05/2023 hemoglobin 13.2 03/11/24 Hgb 12.2  03/03/24 TVUS: Uterus: 8.1 x 6.8 x 5.8 cm, retroverted uterus.  3 fibroids noted the largest measuring 2.0 cm. Endometrial thickness: 6 mm. Left ovary: 2.7 x 1.5 x 8.0 cm, normal-appearing. Right ovary: 2.6 x 1.8 x 2.0 cm, normal-appearing.  Today, she reports cycle started 03/04/23, just stopped yesterday. Did not have refill for megace. Has one more IV Fe infusion scheduled prior to surgery. Started wegovy this week. Denies CP and SOB. Done with childbearing Desires hysterectomy. Of note, she has significant surgical history including laparoscopic sleeve gastrectomy, cholecystectomy, ovarian cystectomy.  Birth control: none Sexually active: yes   GYN HISTORY: Hysteroscopy, 2024 Prior cystectomy, unsure which side  OB History  Gravida Para Term Preterm AB Living  3 3 3  0 0 3  SAB IAB Ectopic Multiple Live Births  0 0 0 0 3    # Outcome Date GA Lbr  Len/2nd Weight Sex Type Anes PTL Lv  3 Term      Vag-Spont   LIV  2 Term      Vag-Spont   LIV  1 Term      Vag-Spont   LIV    Past Medical History:  Diagnosis Date   Anemia    Anxiety    Depression    Hepatitis C    Medical history non-contributory     Past Surgical History:  Procedure Laterality Date   CHOLECYSTECTOMY, LAPAROSCOPIC     LAPAROSCOPIC GASTRIC SLEEVE RESECTION     LAPAROSCOPIC GASTRIC SLEEVE RESECTION  2016   LAPAROSCOPIC OVARIAN CYSTECTOMY  2003    Current Outpatient Medications on File Prior to Visit  Medication Sig Dispense Refill   folic acid (FOLVITE) 1 MG tablet TAKE 1 TABLET(1 MG) BY MOUTH DAILY 90 tablet 1   Prenatal Vit-Fe Fumarate-FA (PRENATAL MULTIVITAMIN) TABS tablet Take 1 tablet by mouth daily at 12 noon.     WEGOVY 0.25 MG/0.5ML SOAJ      IRON, FERROUS SULFATE, PO Take 1 tablet by mouth 2 (two) times daily. 325mg  (Patient not taking: Reported on 03/25/2024)     No current facility-administered medications on file prior to visit.    No Known Allergies    PE Today's Vitals   03/25/24 1602  BP: 122/80  Pulse: 66  Temp: 98.1 F (36.7 C)  TempSrc: Oral  SpO2: 99%  Weight: 163 lb (73.9 kg)  Height: 5' 0.63" (1.54 m)   Body mass index is 31.18 kg/m.  Physical Exam Vitals reviewed.  Constitutional:      General: She is not in acute distress.    Appearance: Normal  appearance.  HENT:     Head: Normocephalic and atraumatic.     Nose: Nose normal.  Eyes:     Extraocular Movements: Extraocular movements intact.     Conjunctiva/sclera: Conjunctivae normal.  Cardiovascular:     Rate and Rhythm: Normal rate and regular rhythm.     Heart sounds: No murmur heard.    No friction rub. No gallop.  Pulmonary:     Effort: Pulmonary effort is normal. No respiratory distress.     Breath sounds: Normal breath sounds. No stridor. No wheezing, rhonchi or rales.  Musculoskeletal:        General: Normal range of motion.     Cervical back: Normal  range of motion.  Neurological:     General: No focal deficit present.     Mental Status: She is alert.  Psychiatric:        Mood and Affect: Mood normal.        Behavior: Behavior normal.      Assessment and Plan:        Preop examination -     Acetaminophen; Take 2 tablets (1,000 mg total) by mouth every 8 (eight) hours as needed.  Dispense: 60 tablet; Refill: 0 -     traMADol HCl; Day 1: Take 1 tablet by mouth every 4 hours as needed. Day 2: Take 1 tablet by mouth every 6 hours as needed. Day 3: Take 1 tablet by mouth every 8 hours as needed.  Day 4: Take 1 tablet by mouth every 12 hours as needed.  Dispense: 15 tablet; Refill: 0 -     Ibuprofen; Take 1 tablet (800 mg total) by mouth every 8 (eight) hours as needed for up to 42 doses.  Dispense: 42 tablet; Refill: 0  Abnormal uterine bleeding (AUB) Assessment & Plan: Patient presents for updated preoperative exam, previously completed 03/03/24 Patient has started [facility] this week and has run out of Megace. New Rx for Megace provided.  Plan for robotic assisted total laparoscopic hysterectomy and bilateral salpingectomy on 04/22/2024. Discussed outpatient procedure. Reviewed that  recovery is usually 6 weeks with additional 4 weeks of pelvic rest. Risks including infections, bleeding, and damage to surrounding organs reviewed. Agreeable to blood transfusion if indicated. Patient in agreement with initial plan.   Preop checklist: Antibiotics: Ancef DVT ppx: SCDs, lovenox Postop visit: 2, 6 week Additional clearance: none Tubal and hysterectomy forms completed previously Will send prescriptions for postop pain management today. Patient aware of need to discontinue Wegovy by 04/08/24.  Orders: -     Megestrol Acetate; Take 1 tablet (40 mg total) by mouth 2 (two) times daily.  Dispense: 60 tablet; Refill: 1   Rosalyn Gess, MD

## 2024-03-25 NOTE — Progress Notes (Signed)
 48 y.o. [id number] female with hepatitis C (negative viral load 07/03/2023, completed treatment 2024, follows with Dr. Elsie Stain at [facility]), AUB, here for preoperative exam: robotic assisted total laparoscopic hysterectomy and bilateral salpingectomy scheduled 04/22/24. Single, relationship x8 years. Children 7-26yo. 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  Surgical history notable for lap sleeve gastrectomy, lap chole, and lap ovarian cystectomy.  Patient's last menstrual period was 03/03/2024 (approximate).   Was seeing a GYN in [place]. Referred by hematology for AUB. On Megace for AUB. Still having HVB with megace 40mg  BID.  Work-up to date: 03/27/2023 Pap NIL  EMB 03/27/23 - Very scanty and minute fragments of detached endometrial glands showing tubal metaplasia and features suggestive of stromal breakdown. - Suboptimal sampling for definitive diagnosis. ( See care everywhere) Patient declined re-sampling at that time   04/2023 Hysteroscopy - benign endo polyp (CE, see 05/29/23 note) 12/05/2023 hemoglobin 13.2 03/11/24 Hgb 12.2  03/03/24 TVUS: Uterus: 8.1 x 6.8 x 5.8 cm, retroverted uterus.  3 fibroids noted the largest measuring 2.0 cm. Endometrial thickness: 6 mm. Left ovary: 2.7 x 1.5 x 8.0 cm, normal-appearing. Right ovary: 2.6 x 1.8 x 2.0 cm, normal-appearing.  Today, she reports cycle started 03/04/23, just stopped yesterday. Did not have refill for megace. Has one more IV Fe infusion scheduled prior to surgery. Started wegovy this week. Denies CP and SOB. Done with childbearing Desires hysterectomy. Of note, she has significant surgical history including laparoscopic sleeve gastrectomy, cholecystectomy, ovarian cystectomy.  Birth control: none Sexually active: yes   GYN HISTORY: Hysteroscopy, 2024 Prior cystectomy, unsure which side  OB History  Gravida Para Term Preterm AB Living  3 3 3  0 0 3  SAB IAB Ectopic Multiple Live Births  0 0 0 0 3    # Outcome Date GA Lbr  Len/2nd Weight Sex Type Anes PTL Lv  3 Term      Vag-Spont   LIV  2 Term      Vag-Spont   LIV  1 Term      Vag-Spont   LIV    Past Medical History:  Diagnosis Date   Anemia    Anxiety    Depression    Hepatitis C    Medical history non-contributory     Past Surgical History:  Procedure Laterality Date   CHOLECYSTECTOMY, LAPAROSCOPIC     LAPAROSCOPIC GASTRIC SLEEVE RESECTION     LAPAROSCOPIC GASTRIC SLEEVE RESECTION  2016   LAPAROSCOPIC OVARIAN CYSTECTOMY  2003    Current Outpatient Medications on File Prior to Visit  Medication Sig Dispense Refill   folic acid (FOLVITE) 1 MG tablet TAKE 1 TABLET(1 MG) BY MOUTH DAILY 90 tablet 1   Prenatal Vit-Fe Fumarate-FA (PRENATAL MULTIVITAMIN) TABS tablet Take 1 tablet by mouth daily at 12 noon.     WEGOVY 0.25 MG/0.5ML SOAJ      IRON, FERROUS SULFATE, PO Take 1 tablet by mouth 2 (two) times daily. 325mg  (Patient not taking: Reported on 03/25/2024)     No current facility-administered medications on file prior to visit.    No Known Allergies    PE Today's Vitals   03/25/24 1602  BP: 122/80  Pulse: 66  Temp: 98.1 F (36.7 C)  TempSrc: Oral  SpO2: 99%  Weight: 163 lb (73.9 kg)  Height: 5' 0.63" (1.54 m)   Body mass index is 31.18 kg/m.  Physical Exam Vitals reviewed.  Constitutional:      General: She is not in acute distress.    Appearance: Normal  appearance.  HENT:     Head: Normocephalic and atraumatic.     Nose: Nose normal.  Eyes:     Extraocular Movements: Extraocular movements intact.     Conjunctiva/sclera: Conjunctivae normal.  Cardiovascular:     Rate and Rhythm: Normal rate and regular rhythm.     Heart sounds: No murmur heard.    No friction rub. No gallop.  Pulmonary:     Effort: Pulmonary effort is normal. No respiratory distress.     Breath sounds: Normal breath sounds. No stridor. No wheezing, rhonchi or rales.  Musculoskeletal:        General: Normal range of motion.     Cervical back: Normal  range of motion.  Neurological:     General: No focal deficit present.     Mental Status: She is alert.  Psychiatric:        Mood and Affect: Mood normal.        Behavior: Behavior normal.      Assessment and Plan:        Preop examination -     Acetaminophen; Take 2 tablets (1,000 mg total) by mouth every 8 (eight) hours as needed.  Dispense: 60 tablet; Refill: 0 -     traMADol HCl; Day 1: Take 1 tablet by mouth every 4 hours as needed. Day 2: Take 1 tablet by mouth every 6 hours as needed. Day 3: Take 1 tablet by mouth every 8 hours as needed.  Day 4: Take 1 tablet by mouth every 12 hours as needed.  Dispense: 15 tablet; Refill: 0 -     Ibuprofen; Take 1 tablet (800 mg total) by mouth every 8 (eight) hours as needed for up to 42 doses.  Dispense: 42 tablet; Refill: 0  Abnormal uterine bleeding (AUB) Assessment & Plan: Patient presents for updated preoperative exam, previously completed 03/03/24 Patient has started [facility] this week and has run out of Megace. New Rx for Megace provided.  Plan for robotic assisted total laparoscopic hysterectomy and bilateral salpingectomy on 04/22/2024. Discussed outpatient procedure. Reviewed that  recovery is usually 6 weeks with additional 4 weeks of pelvic rest. Risks including infections, bleeding, and damage to surrounding organs reviewed. Agreeable to blood transfusion if indicated. Patient in agreement with initial plan.   Preop checklist: Antibiotics: Ancef DVT ppx: SCDs, lovenox Postop visit: 2, 6 week Additional clearance: none Tubal and hysterectomy forms completed previously Will send prescriptions for postop pain management today. Patient aware of need to discontinue Wegovy by 04/08/24.  Orders: -     Megestrol Acetate; Take 1 tablet (40 mg total) by mouth 2 (two) times daily.  Dispense: 60 tablet; Refill: 1   Rosalyn Gess, MD

## 2024-03-26 ENCOUNTER — Telehealth: Payer: Self-pay

## 2024-03-26 NOTE — Telephone Encounter (Signed)
 Received Prior Authorization request for megestrol (Megace) 40 mg tablet from [facility] due to insurance plan not covering. I spoke to patient and she states that she just pays for it out of pocket.

## 2024-04-10 ENCOUNTER — Encounter: Payer: Self-pay | Admitting: *Deleted

## 2024-04-14 ENCOUNTER — Encounter (HOSPITAL_COMMUNITY): Payer: Self-pay | Admitting: Obstetrics and Gynecology

## 2024-04-14 NOTE — Progress Notes (Addendum)
 Spoke w/ via phone for pre-op interview---  pt Lab needs dos----  urine preg       Lab results------ lab appt 04-20-2024 @ 1300 getting CBC/ CMP/ T&S (per anes) COVID test -----patient states asymptomatic no test needed Arrive at -------  1100 on 04-22-2024 NPO after MN NO Solid Food.  Clear liquids from MN until---  1000 Pre-Surgery Ensure or G2:  n/a ERAS  protocol:  yes  Med rec completed Medications to take morning of surgery -----  none Diabetic medication -----  n/a  GLP1 agonist last dose:  04-06-2024 GLP1 instructions:  pt stated was given instructions from surgeon office not to do again until after surgery  Patient instructed no nail polish to be worn day of surgery Patient instructed to bring photo id and insurance card day of surgery Patient aware to have Driver (ride ) / caregiver    for 24 hours after surgery - mother, judy lopez Patient Special Instructions ----- will pick bag w/ soap and written instructions at lab appt Pre-Op special Instructions ----- sent inbox message in epic to Dr Andrena Ke on 04-13-2024,  requested pre-op orders  Patient verbalized understanding of instructions that were given at this phone interview. Patient denies chest pain, sob, fever, cough at the interview.    Anesthesia Review:  chronic hep c, completed 12 wks treatment 03/ 2024;  early hepatic fibrosis F0--F1  (  Fibro scan 12-25-2022  and abdominal ultrasound 12-19-2022  in care everywhere)  PCP:  Dr Ardeth Beckers. [facility]:  Lisbeth Rides NP Holli Lunger 07-08-2023 in epic)  Chest x-ray : EKG : Echo : Stress test: Cardiac Cath :   Activity level:   denies sob w/ any activity Sleep Study/ CPAP :  no Fasting Blood Sugar :      / Checks Blood Sugar -- times a day:    Blood Thinner/ Instructions /Last Dose:  no ASA / Instructions/ Last Dose :  no

## 2024-04-14 NOTE — Pre-Procedure Instructions (Signed)
 Surgical Instructions   Your procedure is scheduled on :  Wednesday ,  04-22-2024. Report to [facility] Main Entrance "A" at 11:00  A.M., then check in with the Admitting office. Any questions or running late day of surgery: call [phone]  Questions prior to your surgery date: call [phone], Monday-Friday, 8am-4pm. If you experience any cold or flu symptoms such as cough, fever, chills, shortness of breath, etc. between now and your scheduled surgery, please notify your surgeon office.    Remember:  Do not eat any food after midnight the night before your surgery.  You may have clear liquids from midnight night before surgery until 10:00 AM.   You may drink clear liquids until 10:00 AM the morning of your surgery.   Clear liquids allowed are:  Water Carbonated Beverages Clear Tea (may sweeten, no milk, honey, etc.) Black Coffee Only (may sweeten, NO MILK, CREAM OR POWDERED CREAMER of any kind) Sport drinks, like Gatorade  NO clear liquids after 10:00 AM day of surgery.  This includes no water,  candy,  gum,  and  mints.    Take these medicines the morning of surgery with A SIP OF WATER :  none   May take these medicines IF NEEDED: none    One week prior to surgery, STOP taking any Aspirin (unless otherwise instructed by your surgeon) Aleve, Naproxen, Ibuprofen , Motrin , Advil , Goody's, BC's, all herbal medications, fish oil, and non-prescription vitamins.                     Do NOT Smoke (Tobacco/Vaping) and Do Not drink alcohol for 24 hours prior to your procedure.  If you use a CPAP at night, you may bring your mask/headgear for your overnight stay.   You will be asked to remove any contacts, glasses, piercing's, hearing aid's, dentures/partials prior to surgery. Please bring cases for these items if needed.    Patients discharged the day of surgery will not be allowed to drive home, and someone needs to stay with them for 24 hours.  SURGICAL WAITING ROOM  VISITATION Patients may have no more than 2 support people in the waiting area - these visitors may rotate.   Pre-op nurse will coordinate an appropriate time for 1 ADULT support person, who may not rotate, to accompany patient in pre-op.  Children under the age of 80 must have an adult with them who is not the patient and must remain in the main waiting area with an adult.  If the patient needs to stay at the hospital during part of their recovery, the visitor guidelines for inpatient rooms apply.  Please refer to the [facility] website for the visitor guidelines for any additional information.   If you received a COVID test during your pre-op visit  it is requested that you wear a mask when out in public, stay away from anyone that may not be feeling well and notify your surgeon if you develop symptoms. If you have been in contact with anyone that has tested positive in the last 10 days please notify you surgeon.      Pre-operative CHG Bathing Instructions   You can play a key role in reducing the risk of infection after surgery. Your skin needs to be as free of germs as possible. You can reduce the number of germs on your skin by washing with CHG (chlorhexidine gluconate) soap before surgery. CHG is an antiseptic soap that kills germs and continues to kill germs even after  washing.   DO NOT use if you have an allergy to chlorhexidine/CHG or antibacterial soaps. If your skin becomes reddened or irritated, stop using the CHG and notify Pre-Op nurse day of surgery.  Please get dial soap or other antibacterial soap and shower following the instructions below.             TAKE A SHOWER THE NIGHT BEFORE SURGERY AND THE DAY OF SURGERY    Please keep in mind the following:  DO NOT shave, including legs and underarms, 48 hours prior to surgery.   You may shave your face before/day of surgery.  Place clean sheets on your bed the night before surgery Use a clean washcloth (not used since being  washed) for each shower. DO NOT sleep with pet's night before surgery.  CHG Shower Instructions:  Wash your face and private area with normal soap. If you choose to wash your hair, wash first with your normal shampoo.  After you use shampoo/soap, rinse your hair and body thoroughly to remove shampoo/soap residue.  Turn the water OFF and apply half the bottle of CHG soap to a CLEAN washcloth.  Apply CHG soap ONLY FROM YOUR NECK DOWN TO YOUR TOES (washing for 3-5 minutes)  DO NOT use CHG soap on face, private areas, open wounds, or sores.  Pay special attention to the area where your surgery is being performed.  If you are having back surgery, having someone wash your back for you may be helpful. Wait 2 minutes after CHG soap is applied, then you may rinse off the CHG soap.  Pat dry with a clean towel  Put on clean pajamas    Additional instructions for the day of surgery: DO NOT APPLY any lotions, powders, oils, deodorants (may use underarm deodorant) , cologne/  perfumes  or makeup Do not wear jewelry / piercing's/  metal/  permanent jewelry must be removed prior to arrival day of surgery.  (No plastic piercing) Do not wear nail polish, gel polish, artificial nails, or any other type of covering on natural finger nails (toe nails are okay) Do not bring valuables to the hospital. [facility] is not responsible for valuables/personal belongings. Put on clean/comfortable clothes.  Please brush your teeth.  Ask your nurse before applying any prescription medications to the skin.

## 2024-04-20 ENCOUNTER — Encounter (HOSPITAL_COMMUNITY)
Admission: RE | Admit: 2024-04-20 | Discharge: 2024-04-20 | Disposition: A | Discharge status: Home / Self Care | Source: Ambulatory Visit | Attending: Obstetrics and Gynecology | Admitting: Obstetrics and Gynecology

## 2024-04-20 DIAGNOSIS — Z01812 Encounter for preprocedural laboratory examination: Secondary | ICD-10-CM | POA: Insufficient documentation

## 2024-04-20 DIAGNOSIS — Z01818 Encounter for other preprocedural examination: Secondary | ICD-10-CM

## 2024-04-20 LAB — COMPREHENSIVE METABOLIC PANEL WITH GFR
ALT: 9 U/L (ref 0–44)
AST: 16 U/L (ref 15–41)
Albumin: 3.6 g/dL (ref 3.5–5.0)
Alkaline Phosphatase: 62 U/L (ref 38–126)
Anion gap: 9 (ref 5–15)
BUN: 12 mg/dL (ref 6–20)
CO2: 26 mmol/L (ref 22–32)
Calcium: 8.9 mg/dL (ref 8.9–10.3)
Chloride: 107 mmol/L (ref 98–111)
Creatinine, Ser: 0.77 mg/dL (ref 0.44–1.00)
GFR, Estimated: >60 mL/min (ref >60)
Glucose, Bld: 94 mg/dL (ref 70–99)
Potassium: 4.3 mmol/L (ref 3.5–5.1)
Sodium: 142 mmol/L (ref 135–145)
Total Bilirubin: 0.5 mg/dL (ref 0.0–1.2)
Total Protein: 7.3 g/dL (ref 6.5–8.1)

## 2024-04-20 LAB — CBC
HCT: 36.9 % (ref 36.0–46.0)
Hemoglobin: 11.4 g/dL — ABNORMAL LOW (ref 12.0–15.0)
MCH: 29.4 pg (ref 26.0–34.0)
MCHC: 30.9 g/dL (ref 30.0–36.0)
MCV: 95.1 fL (ref 80.0–100.0)
Platelets: 321 10*3/uL (ref 150–400)
RBC: 3.88 MIL/uL (ref 3.87–5.11)
RDW: 13.5 % (ref 11.5–15.5)
WBC: 8.8 10*3/uL (ref 4.0–10.5)
nRBC: 0 % (ref 0.0–0.2)

## 2024-04-20 LAB — TYPE AND SCREEN
ABO/RH(D): B POS
Antibody Screen: NEGATIVE

## 2024-04-22 ENCOUNTER — Ambulatory Visit (HOSPITAL_BASED_OUTPATIENT_CLINIC_OR_DEPARTMENT_OTHER): Payer: Self-pay

## 2024-04-22 ENCOUNTER — Encounter (HOSPITAL_COMMUNITY): Payer: Self-pay | Admitting: Obstetrics and Gynecology

## 2024-04-22 ENCOUNTER — Encounter (HOSPITAL_COMMUNITY)
Admission: RE | Disposition: A | Discharge status: Home / Self Care | Payer: Self-pay | Source: Home / Self Care | Attending: Obstetrics and Gynecology

## 2024-04-22 ENCOUNTER — Other Ambulatory Visit: Payer: Self-pay

## 2024-04-22 ENCOUNTER — Ambulatory Visit (HOSPITAL_COMMUNITY)
Admission: RE | Admit: 2024-04-22 | Discharge: 2024-04-22 | Disposition: A | Discharge status: Home / Self Care | Attending: Obstetrics and Gynecology | Admitting: Obstetrics and Gynecology

## 2024-04-22 ENCOUNTER — Ambulatory Visit (HOSPITAL_COMMUNITY): Payer: Self-pay

## 2024-04-22 DIAGNOSIS — Z7985 Long-term (current) use of injectable non-insulin antidiabetic drugs: Secondary | ICD-10-CM | POA: Diagnosis not present

## 2024-04-22 DIAGNOSIS — Z01818 Encounter for other preprocedural examination: Secondary | ICD-10-CM

## 2024-04-22 DIAGNOSIS — N8000 Endometriosis of the uterus, unspecified: Secondary | ICD-10-CM | POA: Diagnosis not present

## 2024-04-22 DIAGNOSIS — Z87891 Personal history of nicotine dependence: Secondary | ICD-10-CM | POA: Insufficient documentation

## 2024-04-22 DIAGNOSIS — N80102 Endometriosis of left ovary, unspecified depth: Secondary | ICD-10-CM | POA: Diagnosis not present

## 2024-04-22 DIAGNOSIS — N8003 Adenomyosis of the uterus: Secondary | ICD-10-CM | POA: Diagnosis not present

## 2024-04-22 DIAGNOSIS — D500 Iron deficiency anemia secondary to blood loss (chronic): Secondary | ICD-10-CM

## 2024-04-22 DIAGNOSIS — D251 Intramural leiomyoma of uterus: Secondary | ICD-10-CM | POA: Insufficient documentation

## 2024-04-22 DIAGNOSIS — N83201 Unspecified ovarian cyst, right side: Secondary | ICD-10-CM

## 2024-04-22 DIAGNOSIS — K660 Peritoneal adhesions (postprocedural) (postinfection): Secondary | ICD-10-CM | POA: Insufficient documentation

## 2024-04-22 DIAGNOSIS — N939 Abnormal uterine and vaginal bleeding, unspecified: Secondary | ICD-10-CM | POA: Insufficient documentation

## 2024-04-22 DIAGNOSIS — N736 Female pelvic peritoneal adhesions (postinfective): Secondary | ICD-10-CM | POA: Insufficient documentation

## 2024-04-22 DIAGNOSIS — D649 Anemia, unspecified: Secondary | ICD-10-CM | POA: Diagnosis not present

## 2024-04-22 HISTORY — PX: HYSTERECTOMY, TOTAL, LAPAROSCOPIC, ROBOT-ASSISTED WITH SALPINGECTOMY: SHX7587

## 2024-04-22 HISTORY — DX: Unspecified ovarian cyst, right side: N83.201

## 2024-04-22 HISTORY — PX: ROBOTIC ASSISTED LAPAROSCOPIC LYSIS OF ADHESION: SHX6080

## 2024-04-22 HISTORY — PX: CYSTOSCOPY: SHX5120

## 2024-04-22 HISTORY — DX: Fatty (change of) liver, not elsewhere classified: K76.0

## 2024-04-22 HISTORY — DX: Iron deficiency anemia secondary to blood loss (chronic): D50.0

## 2024-04-22 HISTORY — DX: Abnormal uterine and vaginal bleeding, unspecified: N93.9

## 2024-04-22 LAB — CBC
HCT: 34.1 % — ABNORMAL LOW (ref 36.0–46.0)
Hemoglobin: 10.8 g/dL — ABNORMAL LOW (ref 12.0–15.0)
MCH: 29.3 pg (ref 26.0–34.0)
MCHC: 31.7 g/dL (ref 30.0–36.0)
MCV: 92.7 fL (ref 80.0–100.0)
Platelets: 317 10*3/uL (ref 150–400)
RBC: 3.68 MIL/uL — ABNORMAL LOW (ref 3.87–5.11)
RDW: 13.5 % (ref 11.5–15.5)
WBC: 6.7 10*3/uL (ref 4.0–10.5)
nRBC: 0 % (ref 0.0–0.2)

## 2024-04-22 LAB — POCT PREGNANCY, URINE: Preg Test, Ur: NEGATIVE

## 2024-04-22 LAB — ABO/RH: ABO/RH(D): B POS

## 2024-04-22 SURGERY — HYSTERECTOMY, TOTAL, LAPAROSCOPIC, ROBOT-ASSISTED WITH SALPINGECTOMY
Anesthesia: General | Site: Pelvis | Laterality: Bilateral

## 2024-04-22 MED ORDER — CELECOXIB 200 MG PO CAPS
ORAL_CAPSULE | ORAL | Status: AC
  Administered 2024-04-22: 400 mg
  Filled 2024-04-22: qty 2

## 2024-04-22 MED ORDER — CHLORHEXIDINE GLUCONATE 0.12 % MT SOLN
OROMUCOSAL | Status: AC
  Administered 2024-04-22: 15 mL
  Filled 2024-04-22: qty 15

## 2024-04-22 MED ORDER — OXYCODONE HCL 5 MG/5ML PO SOLN: 5.0000 mg | Freq: Once | ORAL | Status: DC | PRN

## 2024-04-22 MED ORDER — ENOXAPARIN SODIUM 40 MG/0.4ML IJ SOSY
PREFILLED_SYRINGE | INTRAMUSCULAR | Status: AC
  Filled 2024-04-22: qty 0.4

## 2024-04-22 MED ORDER — LACTATED RINGERS IV SOLN: INTRAVENOUS | Status: DC

## 2024-04-22 MED ORDER — SODIUM CHLORIDE 0.9 % IR SOLN
Status: DC | PRN
  Administered 2024-04-22: 1000 mL

## 2024-04-22 MED ORDER — BUPIVACAINE HCL (PF) 0.5 % IJ SOLN
INTRAMUSCULAR | Status: DC | PRN
Start: 2024-04-22 — End: 2024-04-22
  Administered 2024-04-22: 28 mL

## 2024-04-22 MED ORDER — ROCURONIUM BROMIDE 10 MG/ML (PF) SYRINGE
PREFILLED_SYRINGE | INTRAVENOUS | Status: AC
  Filled 2024-04-22: qty 10

## 2024-04-22 MED ORDER — HYDROMORPHONE HCL 1 MG/ML IJ SOLN
INTRAMUSCULAR | Status: AC
  Filled 2024-04-22: qty 1

## 2024-04-22 MED ORDER — HYDRALAZINE HCL 20 MG/ML IJ SOLN
INTRAMUSCULAR | Status: DC | PRN
  Administered 2024-04-22: 4 mg via INTRAVENOUS

## 2024-04-22 MED ORDER — HYDROMORPHONE HCL 1 MG/ML IJ SOLN
0.2500 mg | INTRAMUSCULAR | Status: DC | PRN
  Administered 2024-04-22 (×2): 0.5 mg via INTRAVENOUS

## 2024-04-22 MED ORDER — ONDANSETRON HCL 4 MG/2ML IJ SOLN
INTRAMUSCULAR | Status: AC
  Filled 2024-04-22: qty 2

## 2024-04-22 MED ORDER — ONDANSETRON HCL 4 MG/2ML IJ SOLN
INTRAMUSCULAR | Status: DC | PRN
  Administered 2024-04-22: 4 mg via INTRAVENOUS

## 2024-04-22 MED ORDER — DEXAMETHASONE SODIUM PHOSPHATE 10 MG/ML IJ SOLN
INTRAMUSCULAR | Status: DC | PRN
Start: 2024-04-22 — End: 2024-04-22
  Administered 2024-04-22: 10 mg via INTRAVENOUS

## 2024-04-22 MED ORDER — CELECOXIB 200 MG PO CAPS: 400.0000 mg | ORAL_CAPSULE | ORAL | Status: DC

## 2024-04-22 MED ORDER — SUGAMMADEX SODIUM 200 MG/2ML IV SOLN
INTRAVENOUS | Status: DC | PRN
  Administered 2024-04-22: 150 mg via INTRAVENOUS

## 2024-04-22 MED ORDER — GABAPENTIN 300 MG PO CAPS
300.0000 mg | ORAL_CAPSULE | ORAL | Status: AC
  Administered 2024-04-22: 300 mg via ORAL

## 2024-04-22 MED ORDER — AMISULPRIDE (ANTIEMETIC) 5 MG/2ML IV SOLN
INTRAVENOUS | Status: AC
  Filled 2024-04-22: qty 4

## 2024-04-22 MED ORDER — CEFAZOLIN SODIUM-DEXTROSE 2-4 GM/100ML-% IV SOLN
2.0000 g | INTRAVENOUS | Status: AC
  Administered 2024-04-22: 2 g via INTRAVENOUS

## 2024-04-22 MED ORDER — ROCURONIUM BROMIDE 10 MG/ML (PF) SYRINGE
PREFILLED_SYRINGE | INTRAVENOUS | Status: DC | PRN
  Administered 2024-04-22: 10 mg via INTRAVENOUS
  Administered 2024-04-22 (×2): 20 mg via INTRAVENOUS
  Administered 2024-04-22: 50 mg via INTRAVENOUS

## 2024-04-22 MED ORDER — FENTANYL CITRATE (PF) 250 MCG/5ML IJ SOLN
INTRAMUSCULAR | Status: AC
Start: 2024-04-22 — End: ?
  Filled 2024-04-22: qty 5

## 2024-04-22 MED ORDER — MEPERIDINE HCL 25 MG/ML IJ SOLN: 6.2500 mg | INTRAMUSCULAR | Status: DC | PRN

## 2024-04-22 MED ORDER — LIDOCAINE 2% (20 MG/ML) 5 ML SYRINGE
INTRAMUSCULAR | Status: DC | PRN
Start: 2024-04-22 — End: 2024-04-22
  Administered 2024-04-22: 100 mg via INTRAVENOUS

## 2024-04-22 MED ORDER — BUPIVACAINE HCL (PF) 0.5 % IJ SOLN
INTRAMUSCULAR | Status: AC
  Filled 2024-04-22: qty 30

## 2024-04-22 MED ORDER — PROPOFOL 10 MG/ML IV BOLUS
INTRAVENOUS | Status: DC | PRN
  Administered 2024-04-22: 150 mg via INTRAVENOUS

## 2024-04-22 MED ORDER — ACETAMINOPHEN 500 MG PO TABS: 1000.0000 mg | ORAL_TABLET | ORAL | Status: DC

## 2024-04-22 MED ORDER — MIDAZOLAM HCL 2 MG/2ML IJ SOLN
INTRAMUSCULAR | Status: AC
  Filled 2024-04-22: qty 2

## 2024-04-22 MED ORDER — FENTANYL CITRATE (PF) 250 MCG/5ML IJ SOLN
INTRAMUSCULAR | Status: DC | PRN
Start: 2024-04-22 — End: 2024-04-22
  Administered 2024-04-22 (×2): 25 ug via INTRAVENOUS
  Administered 2024-04-22 (×4): 50 ug via INTRAVENOUS

## 2024-04-22 MED ORDER — METRONIDAZOLE 500 MG/100ML IV SOLN
INTRAVENOUS | Status: AC
Start: 2024-04-22 — End: 2024-04-22
  Filled 2024-04-22: qty 100

## 2024-04-22 MED ORDER — ACETAMINOPHEN 500 MG PO TABS
ORAL_TABLET | ORAL | Status: AC
  Administered 2024-04-22: 1000 mg
  Filled 2024-04-22: qty 2

## 2024-04-22 MED ORDER — CHLORHEXIDINE GLUCONATE 0.12 % MT SOLN: 15.0000 mL | Freq: Once | OROMUCOSAL | Status: DC

## 2024-04-22 MED ORDER — 0.9 % SODIUM CHLORIDE (POUR BTL) OPTIME
TOPICAL | Status: DC | PRN
  Administered 2024-04-22: 500 mL
  Administered 2024-04-22: 1000 mL

## 2024-04-22 MED ORDER — SODIUM CHLORIDE 0.9 % IV SOLN: 12.5000 mg | INTRAVENOUS | Status: DC | PRN

## 2024-04-22 MED ORDER — PROPOFOL 10 MG/ML IV BOLUS
INTRAVENOUS | Status: AC
  Filled 2024-04-22: qty 20

## 2024-04-22 MED ORDER — OXYCODONE HCL 5 MG PO TABS: 5.0000 mg | ORAL_TABLET | Freq: Once | ORAL | Status: DC | PRN

## 2024-04-22 MED ORDER — ORAL CARE MOUTH RINSE: 15.0000 mL | Freq: Once | OROMUCOSAL | Status: DC

## 2024-04-22 MED ORDER — METRONIDAZOLE 500 MG/100ML IV SOLN
500.0000 mg | Freq: Two times a day (BID) | INTRAVENOUS | Status: DC
  Administered 2024-04-22: 500 mg via INTRAVENOUS

## 2024-04-22 MED ORDER — LIDOCAINE 2% (20 MG/ML) 5 ML SYRINGE
INTRAMUSCULAR | Status: AC
  Filled 2024-04-22: qty 5

## 2024-04-22 MED ORDER — EPHEDRINE 5 MG/ML INJ
INTRAVENOUS | Status: AC
  Filled 2024-04-22: qty 5

## 2024-04-22 MED ORDER — ENOXAPARIN SODIUM 40 MG/0.4ML IJ SOSY
40.0000 mg | PREFILLED_SYRINGE | INTRAMUSCULAR | Status: AC
  Administered 2024-04-22: 40 mg via SUBCUTANEOUS

## 2024-04-22 MED ORDER — CEFAZOLIN SODIUM-DEXTROSE 2-4 GM/100ML-% IV SOLN
INTRAVENOUS | Status: AC
  Filled 2024-04-22: qty 100

## 2024-04-22 MED ORDER — DEXAMETHASONE SODIUM PHOSPHATE 10 MG/ML IJ SOLN
INTRAMUSCULAR | Status: AC
  Filled 2024-04-22: qty 1

## 2024-04-22 MED ORDER — MIDAZOLAM HCL 5 MG/5ML IJ SOLN
INTRAMUSCULAR | Status: DC | PRN
Start: 2024-04-22 — End: 2024-04-22
  Administered 2024-04-22: 2 mg via INTRAVENOUS

## 2024-04-22 MED ORDER — HEMOSTATIC AGENTS (NO CHARGE) OPTIME
TOPICAL | Status: DC | PRN
  Administered 2024-04-22: 1 via TOPICAL

## 2024-04-22 MED ORDER — PHENYLEPHRINE 80 MCG/ML (10ML) SYRINGE FOR IV PUSH (FOR BLOOD PRESSURE SUPPORT)
PREFILLED_SYRINGE | INTRAVENOUS | Status: AC
  Filled 2024-04-22: qty 10

## 2024-04-22 MED ORDER — EPHEDRINE SULFATE (PRESSORS) 50 MG/ML IJ SOLN
INTRAMUSCULAR | Status: DC | PRN
  Administered 2024-04-22 (×2): 5 mg via INTRAVENOUS

## 2024-04-22 MED ORDER — GABAPENTIN 300 MG PO CAPS
ORAL_CAPSULE | ORAL | Status: AC
  Filled 2024-04-22: qty 1

## 2024-04-22 MED ORDER — AMISULPRIDE (ANTIEMETIC) 5 MG/2ML IV SOLN
10.0000 mg | Freq: Once | INTRAVENOUS | Status: AC | PRN
  Administered 2024-04-22: 10 mg via INTRAVENOUS

## 2024-04-22 SURGICAL SUPPLY — 51 items
APPLICATOR ARISTA FLEXITIP XL (MISCELLANEOUS) ×1 IMPLANT
CHLORAPREP W/TINT 26 (MISCELLANEOUS) ×5 IMPLANT
COVER BACK TABLE 60X90IN (DRAPES) ×4 IMPLANT
COVER TIP SHEARS 8 DVNC (MISCELLANEOUS) ×4 IMPLANT
DEFOGGER SCOPE WARM SEASHARP (MISCELLANEOUS) ×4 IMPLANT
DERMABOND ADVANCED .7 DNX12 (GAUZE/BANDAGES/DRESSINGS) ×4 IMPLANT
DRAPE ARM DVNC X/XI (DISPOSABLE) ×16 IMPLANT
DRAPE COLUMN DVNC XI (DISPOSABLE) ×4 IMPLANT
DRAPE SURG IRRIG POUCH 19X23 (DRAPES) ×4 IMPLANT
DRAPE UTILITY XL STRL (DRAPES) ×4 IMPLANT
DRIVER NDL MEGA SUTCUT DVNCXI (INSTRUMENTS) IMPLANT
DRIVER NDLE MEGA SUTCUT DVNCXI (INSTRUMENTS) ×4 IMPLANT
ELECTRODE REM PT RTRN 9FT ADLT (ELECTROSURGICAL) ×4 IMPLANT
FORCEPS PROGRASP DVNC XI (FORCEP) ×1 IMPLANT
GLOVE BIO SURGEON STRL SZ7 (GLOVE) ×12 IMPLANT
GLOVE BIOGEL PI IND STRL 6 (GLOVE) ×2 IMPLANT
GLOVE BIOGEL PI IND STRL 6.5 (GLOVE) ×1 IMPLANT
GLOVE BIOGEL PI IND STRL 7.0 (GLOVE) ×16 IMPLANT
GLOVE SURG SS PI 6.5 STRL IVOR (GLOVE) ×1 IMPLANT
GLOVE SURG SYN 6.5 ES PF (GLOVE) ×8 IMPLANT
GLOVE SURG SYN 6.5 PF PI (GLOVE) IMPLANT
GOWN STRL REUS W/ TWL LRG LVL3 (GOWN DISPOSABLE) ×5 IMPLANT
GOWN STRL REUS W/ TWL XL LVL3 (GOWN DISPOSABLE) ×12 IMPLANT
HEMOSTAT ARISTA ABSORB 3G PWDR (HEMOSTASIS) ×1 IMPLANT
HIBICLENS CHG 4% 4OZ BTL (MISCELLANEOUS) ×8 IMPLANT
IRRIGATION SUCT STRKRFLW 2 WTP (MISCELLANEOUS) ×4 IMPLANT
IV NS 1000ML BAXH (IV SOLUTION) ×2 IMPLANT
KIT PINK PAD W/HEAD ARE REST (MISCELLANEOUS) ×4 IMPLANT
KIT PINK PAD W/HEAD ARM REST (MISCELLANEOUS) ×3 IMPLANT
KIT TURNOVER KIT B (KITS) ×4 IMPLANT
LEGGING LITHOTOMY PAIR STRL (DRAPES) ×4 IMPLANT
MANIFOLD NEPTUNE II (INSTRUMENTS) ×4 IMPLANT
NS IRRIG 1000ML POUR BTL (IV SOLUTION) ×4 IMPLANT
OBTURATOR OPTICAL LONG 8 DVNC (TROCAR) ×1 IMPLANT
OBTURATOR OPTICALSTD 8 DVNC (TROCAR) ×4 IMPLANT
PACK ROBOT WH (CUSTOM PROCEDURE TRAY) ×4 IMPLANT
PAD OB MATERNITY 11 LF (PERSONAL CARE ITEMS) ×4 IMPLANT
RUMI II 3.0CM BLUE KOH-EFFICIE (DISPOSABLE) ×1 IMPLANT
RUMI II GYRUS 3.5CM BLUE (DISPOSABLE) ×1 IMPLANT
SCISSORS MNPLR CVD DVNC XI (INSTRUMENTS) ×1 IMPLANT
SEAL UNIV 5-12 XI (MISCELLANEOUS) ×13 IMPLANT
SEALER VESSEL EXT DVNC XI (MISCELLANEOUS) ×1 IMPLANT
SET CYSTO W/LG BORE CLAMP LF (SET/KITS/TRAYS/PACK) ×1 IMPLANT
SET TUBE SMOKE EVAC HIGH FLOW (TUBING) ×4 IMPLANT
SOL PREP POV-IOD 4OZ 10% (MISCELLANEOUS) ×1 IMPLANT
SPIKE FLUID TRANSFER (MISCELLANEOUS) ×4 IMPLANT
SUT MNCRL AB 3-0 PS2 18 (SUTURE) ×5 IMPLANT
SUT VLOC 180 0 9IN GS21 (SUTURE) ×8 IMPLANT
TIP UTERINE 6.7X8CM BLUE DISP (MISCELLANEOUS) ×1 IMPLANT
TOWEL GREEN STERILE (TOWEL DISPOSABLE) ×4 IMPLANT
UNDERPAD 30X36 HEAVY ABSORB (UNDERPADS AND DIAPERS) ×4 IMPLANT

## 2024-04-22 NOTE — Op Note (Signed)
 04/22/2024  [id number]  Heidi [name]   OPERATIVE REPORT   Preop Diagnosis: AUB, right ovarian cyst Procedure: Robotic assisted total laparoscopic hysterectomy, bilateral salpingectomy, cystoscopy, lysis of adhesions, abdominal wall repair   Surgeon: Romaine Closs, MD Elisha Guillaume, MD- assisting MD due to complexity of case  Assistant: Circulator: [place], Neville Barbone, RN Relief Scrub: Faye Hoops Scrub Person: Annabelle Barrack, Marina  Ferris Hua, Racheal T Vendor Representative : Mower, Musician: Marnette Sinner, RN RN First Assistant: Antony Kling, RN   Fluids: please see anesthesia report   Complications: None Anesthesia: General   Findings: Omental and bowel adhesions to anterior abdominal wall. Normal 8 cm uterus, normal ovaries and tubes.  Right pelvic adhesions noted.  Endometriosis implants along posterior uterine wall and left ovarian fossa. Cystoscopy at the end of the case with normal bladder and patent ureters bilaterally.  Bilateral jets noted.   Estimated blood loss: Minimal <25cc   Specimens:  ID Type Source Tests Collected by Time Destination  1 : cervix, uterus, bilateral fallopian tubes Tissue PATH Gyn benign resection SURGICAL PATHOLOGY Romaine Closs, MD 04/22/2024 1600      Disposition of specimen: Pathology   Patient is taken to the operating room. She is placed in the supine position. She is a running IV in place. Informed consent was present on the chart. SCDs on her lower extremities and functioning properly. General endotracheal anesthesia was administered by the anesthesia staff without difficulty. Her legs were placed in the low lithotomy position in [place] stirrups. Her arms were tucked by the side.     Chloraprep was then used to prep the abdomen and Hibiclens was used to prep the inner thighs, perineum and vagina. Once 3 minutes had past the patient was draped in a normal standard fashion. A proper time out was  performed and everyone agreed.  The legs were lifted to the high lithotomy position. A weighted speculum was inserted into the vagina and the anterior lip of the cervix was grasped with single-tooth tenaculum.  The uterus sounded to 8 cm. Pratt dilators were used to dilate the cervix to 21fr.  The RUMI uterine manipulator was obtained inserted into the endometrial cavity and the bulb of the disposable tip was inflated with 6 cc of normal saline. There was a good fit of the KOH ring around the cervix. The tenaculum and bivavle speculum was removed. There is also good manipulation of the uterus.  A Foley catheter was placed to straight drain.  Clear urine was noted. Legs were lowered to the low lithotomy position and attention was turned the abdomen.   Superior to the umbilicus, 0.5% marcaine used to anesthetize the skin.  Using #11 blade, 8mm skin incision was made 4 cm above the umbilicus.  The 8mm robotic trocar and sleeve was inserted under direct visualization.  The long robotic trocar was needed due to body habitus.  CO2 gas was started and patient was placed in trendelenburg position.  Two additional 8mm ports were placed under direct visualization in the left and right lower quadrant.   A fourth port was placed in the LUQ.   Omental adhesions were removed with judicious use of electrocautery with the assistance of Dr. Tia Flowers.  A single loop of bowel was noted adherent to the anterior abdominal wall.  This was taken down with sharp dissection.  An anterior abdominal wall defect was noted at this point.   Ureters were identifies.  Attention was turned to the right  side.  The remnant of the right fallopian tube was cauterized, transected, and handed off for specimen.  Attention was turned to the right round ligament.  Right round ligament was serially clamped cauterized and incised.  The utero-ovarian ligament was then cauterized and ligated. The anterior and posterior peritoneum of the inferior leaf of  the broad ligament were opened. The beginning of the bladder flap was created.  The bladder was taken down below the level of the KOH ring. The right uterine artery skeletonized and then just superior to the KOH ring this vessel was serially clamped, cauterized, and incised.   The same procedure was completed on the left to transect the left fallopian tube remnant, left utero-ovarian ligaments. The anterior and posterior peritoneum of the inferior leaf of the broad ligament were opened. The remainder of the bladder flap was created using sharp dissection. The bladder was well below the level of the KOH ring. The left uterine artery skeletonized. Then the left uterine artery, above the level of the KOH ring, was serially clamped cauterized and incised. The uterus was devascularized at this point.   The colpotomy was performed.  This was carried around a circumferential fashion until the vaginal mucosa was completely incised in the specimen was freed.  The specimen was then delivered to the vagina.  A vaginal occlusive device was used to maintain the pneumoperitoneum   Instruments were changed with a needle driver and prograsp.  Using a 9 inch 0 V-lock suture, the cuff was closed by incorporating the anterior and posterior vaginal mucosa in each stitch. This was carried across all the way to the left corner and a running fashion. Two stitches were brought back towards the midline and the suture was cut flush with the vagina. The needle was brought out the pelvis. The second running stitch incorporating the posterior peritoneum was completed using 0 V-lock. Suture needle was removed from the pelvis. The pelvis was irrigated. All pedicles were inspected. No bleeding was noted.   CO2 pressures were lowered to 12mm Hg.  Again, no bleeding was noted.    The anterior abdominal wall defect was closed with 0 V-Loc using a figure-of-eight.  Arista was applied to the surgical beds. At this point the procedure was  completed.  The remaining instruments were removed.  The ports were removed under direct visualization of the laparoscope and the pneumoperitoneum was relieved.   The Foley catheter was removed.  Cystoscopy was performed by Dr. Tia Flowers.  No sutures or bladder injuries were noted.  Ureters were noted with normal urine jets from each one was seen.  Cystoscope was removed. Attention was then turned the vagina and the cuff was inspected.   The skin was then closed with subcuticular stitches of 3-0 Vicryl. The skin was cleansed. Dermabond was applied.  No bleeding was noted.  Sponge, lap, needle, instrument counts were correct x2. Patient tolerated the procedure very well. She was awakened from anesthesia, extubated and taken to recovery in stable condition.    Romaine Closs, MD

## 2024-04-22 NOTE — Anesthesia Preprocedure Evaluation (Signed)
 Anesthesia Evaluation  Patient identified by MRN, date of birth, ID band Patient awake    Reviewed: Allergy & Precautions, H&P , NPO status , Patient's Chart, lab work & pertinent test results  Airway Mallampati: II  TM Distance: >3 FB Neck ROM: Full    Dental no notable dental hx.    Pulmonary neg pulmonary ROS, former smoker   Pulmonary exam normal breath sounds clear to auscultation       Cardiovascular negative cardio ROS Normal cardiovascular exam Rhythm:Regular Rate:Normal     Neuro/Psych   Anxiety Depression    negative neurological ROS  negative psych ROS   GI/Hepatic negative GI ROS, Neg liver ROS,,,  Endo/Other  negative endocrine ROS    Renal/GU negative Renal ROS  negative genitourinary   Musculoskeletal negative musculoskeletal ROS (+)    Abdominal  (+) + obese  Peds negative pediatric ROS (+)  Hematology  (+) Blood dyscrasia, anemia   Anesthesia Other Findings   Reproductive/Obstetrics negative OB ROS                             Anesthesia Physical Anesthesia Plan  ASA: 2  Anesthesia Plan: General   Post-op Pain Management: Gabapentin PO (pre-op)*, Celebrex PO (pre-op)* and Tylenol  PO (pre-op)*   Induction: Intravenous  PONV Risk Score and Plan: 3 and Ondansetron, Dexamethasone, Treatment may vary due to age or medical condition and Midazolam  Airway Management Planned: Oral ETT  Additional Equipment:   Intra-op Plan:   Post-operative Plan: Extubation in OR  Informed Consent: I have reviewed the patients History and Physical, chart, labs and discussed the procedure including the risks, benefits and alternatives for the proposed anesthesia with the patient or authorized representative who has indicated his/her understanding and acceptance.     Dental advisory given  Plan Discussed with: CRNA  Anesthesia Plan Comments:        Anesthesia Quick  Evaluation

## 2024-04-22 NOTE — Discharge Instructions (Addendum)
 Plan for soft diet at home over next 5 days. This would include eggs, soft fruit (like banana), mash potatoes, rice, beans, bread, fish.  Limit intake of nuts, red meat, raw vegetables.  Take ibuprofen  and tylenol  as prescribed. Use tramadol  as needed.

## 2024-04-22 NOTE — Interval H&P Note (Signed)
 Date of Initial H&P: 03/25/24.  History reviewed, patient examined, no change in status, stable for surgery.

## 2024-04-22 NOTE — Anesthesia Procedure Notes (Addendum)
 Procedure Name: Intubation Date/Time: 04/22/2024 1:47 PM  Performed by: Adolphus Hoops, CRNAPre-anesthesia Checklist: Patient identified, Emergency Drugs available, Suction available and Patient being monitored Patient Re-evaluated:Patient Re-evaluated prior to induction Oxygen Delivery Method: Circle System Utilized Preoxygenation: Pre-oxygenation with 100% oxygen Induction Type: IV induction Ventilation: Mask ventilation without difficulty Laryngoscope Size: Mac and 3 Grade View: Grade I Tube type: Oral Tube size: 7.0 mm Number of attempts: 1 Airway Equipment and Method: Stylet Placement Confirmation: ETT inserted through vocal cords under direct vision, positive ETCO2 and breath sounds checked- equal and bilateral Secured at: 21 cm Tube secured with: Tape Dental Injury: Teeth and Oropharynx as per pre-operative assessment

## 2024-04-22 NOTE — Transfer of Care (Signed)
 Immediate Anesthesia Transfer of Care Note  Patient: Heidi [name]  Procedure(s) Performed: HYSTERECTOMY, TOTAL, LAPAROSCOPIC, ROBOT-ASSISTED WITH SALPINGECTOMY (Bilateral: Pelvis) CYSTOSCOPY LYSIS, ADHESIONS, ROBOT-ASSISTED, LAPAROSCOPIC/ ABDOMINAL WALL REPAIRED (Abdomen)  Patient Location: PACU  Anesthesia Type:General  Level of Consciousness: awake, alert , and oriented  Airway & Oxygen Therapy: Patient Spontanous Breathing and Patient connected to face mask oxygen  Post-op Assessment: Report given to RN and Post -op Vital signs reviewed and stable  Post vital signs: Reviewed and stable  Last Vitals:  Vitals Value Taken Time  BP 94/77 04/22/24 1700  Temp    Pulse 93 04/22/24 1703  Resp 19 04/22/24 1703  SpO2 99 % 04/22/24 1703  Vitals shown include unfiled device data.  Last Pain:  Vitals:   04/22/24 1124  TempSrc: Oral      Patients Stated Pain Goal: 6 (04/22/24 1125)  Complications: No notable events documented.

## 2024-04-23 ENCOUNTER — Encounter (HOSPITAL_COMMUNITY): Payer: Self-pay | Admitting: Obstetrics and Gynecology

## 2024-04-23 NOTE — Anesthesia Postprocedure Evaluation (Signed)
 Anesthesia Post Note  Patient: Relda Gleason  Procedure(s) Performed: HYSTERECTOMY, TOTAL, LAPAROSCOPIC, ROBOT-ASSISTED WITH SALPINGECTOMY (Bilateral: Pelvis) CYSTOSCOPY LYSIS, ADHESIONS, ROBOT-ASSISTED, LAPAROSCOPIC/ ABDOMINAL WALL REPAIRED (Abdomen)     Patient location during evaluation: PACU Anesthesia Type: General Level of consciousness: awake and alert Pain management: pain level controlled Vital Signs Assessment: post-procedure vital signs reviewed and stable Respiratory status: spontaneous breathing, nonlabored ventilation and respiratory function stable Cardiovascular status: blood pressure returned to baseline and stable Postop Assessment: no apparent nausea or vomiting Anesthetic complications: no   No notable events documented.  Last Vitals:  Vitals:   04/22/24 1815 04/22/24 1830  BP: 104/68 107/74  Pulse: 63 72  Resp: 14 14  Temp:    SpO2: 97% 97%    Last Pain:  Vitals:   04/22/24 1830  TempSrc:   PainSc: 3    Pain Goal: Patients Stated Pain Goal: 6 (04/22/24 1125)                 Earvin Goldberg

## 2024-04-24 ENCOUNTER — Telehealth: Payer: Self-pay | Admitting: Obstetrics and Gynecology

## 2024-04-24 LAB — SURGICAL PATHOLOGY

## 2024-04-24 NOTE — Telephone Encounter (Signed)
 Called patient for postop check. Doing well. Took a shower today.  No N/V. Pain 3/10. Only needing tylenol  and ibuprofen  once a day. +flatus. No BM yet. On soft diet due to LOA. Continue for 1 wk postop. All questions answered.

## 2024-05-06 ENCOUNTER — Ambulatory Visit (INDEPENDENT_AMBULATORY_CARE_PROVIDER_SITE_OTHER): Admitting: Obstetrics and Gynecology

## 2024-05-06 VITALS — BP 112/64 | HR 75 | Ht 60.75 in | Wt 165.8 lb

## 2024-05-06 DIAGNOSIS — Z09 Encounter for follow-up examination after completed treatment for conditions other than malignant neoplasm: Secondary | ICD-10-CM

## 2024-05-06 NOTE — Patient Instructions (Addendum)
 Keep incisions dry.  Apply Neosporin ointment to umbilical incision. Continue pelvic rest for 10 weeks or longer if you are still spotting or cramping. This includes use of tampons, intercourse/sex, douching, public bathing (including hot tubs, pools, the ocean).

## 2024-05-06 NOTE — Progress Notes (Unsigned)
 48 y.o. G21P3003 female with hepatitis C (negative viral load 07/03/2023, completed treatment 2024, follows with Dr. Chauncey Cora at [facility]), AUB, here for preoperative exam: robotic assisted total laparoscopic hysterectomy and bilateral salpingectomy scheduled 04/22/24. Single, relationship x8 years. Children 7-26yo. 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  Surgical history notable for lap sleeve gastrectomy, lap chole, and lap ovarian cystectomy.  Patient's last menstrual period was 04/13/2023 (approximate).    04/22/24 pathology: A. UTERUS AND CERVIX, WITH BILATERAL FALLOPIAN TUBES, HYSTERECTOMY:  - Cervix: Low-grade squamous intraepithelial lesion  - Endometrium: Benign, no hyperplasia or malignancy  - Myometrium: Adenomyosis, leiomyoma  - Bilateral fallopian tubes: No significant pathologic changes   Birth control: none Sexually active: yes   GYN HISTORY: Hysteroscopy, 2024 Prior cystectomy, unsure which side  OB History  Gravida Para Term Preterm AB Living  3 3 3  0 0 3  SAB IAB Ectopic Multiple Live Births  0 0 0 0 3    # Outcome Date GA Lbr Len/2nd Weight Sex Type Anes PTL Lv  3 Term      Vag-Spont   LIV  2 Term      Vag-Spont   LIV  1 Term      Vag-Spont   LIV    Past Medical History:  Diagnosis Date   Abnormal uterine bleeding (AUB)    Anxiety    Chronic hepatitis C without hepatic coma (HCC) 11/2022   [facility]--- Lisbeth Rides NP;  dx 12/ 2023  Genotype !a,  started Epclusa treated 01/ 2024 completed 12 weeks;  Fibroscan 12-25-2022 and abdominal ultrasound 12-19-2022 results in care everywhere,  F0--F1,  test show negative for detection of HVCRNA   Depression    Hepatitis C    Iron  deficiency anemia due to chronic blood loss    [facility]gy--- [facility];   iron  infusions june and sept 2024   Metabolic dysfunction-associated steatotic liver disease (MASLD)    early hepatic fibroosis   Right ovarian cyst     Past Surgical History:   Procedure Laterality Date   CHOLECYSTECTOMY, LAPAROSCOPIC  1998   CYSTOSCOPY  04/22/2024   Procedure: CYSTOSCOPY;  Surgeon: Romaine Closs, MD;  [facility]: [facility];  Service: Gynecology;;   HYSTERECTOMY, TOTAL, LAPAROSCOPIC, ROBOT-ASSISTED WITH SALPINGECTOMY Bilateral 04/22/2024   Procedure: HYSTERECTOMY, TOTAL, LAPAROSCOPIC, ROBOT-ASSISTED WITH SALPINGECTOMY;  Surgeon: Romaine Closs, MD;  Location: [facility];  Service: Gynecology;  Laterality: Bilateral;   LAPAROSCOPIC GASTRIC SLEEVE RESECTION  2016   LAPAROSCOPIC OVARIAN CYSTECTOMY  2003   LAPAROSCOPIC TUBAL LIGATION Bilateral 07/2019   [facility] by dr b. Kenith Payer;   fulgeration   (per physician note in epic)   ROBOTIC ASSISTED LAPAROSCOPIC LYSIS OF ADHESION  04/22/2024   Procedure: LYSIS, ADHESIONS, ROBOT-ASSISTED, LAPAROSCOPIC/ ABDOMINAL WALL REPAIRED;  Surgeon: Romaine Closs, MD;  Location: [facility];  Service: Gynecology;;    Current Outpatient Medications on File Prior to Visit  Medication Sig Dispense Refill   acetaminophen  (TYLENOL ) 500 MG tablet Take 2 tablets (1,000 mg total) by mouth every 8 (eight) hours as needed. (Patient not taking: Reported on 05/06/2024) 60 tablet 0   folic acid  (FOLVITE ) 1 MG tablet TAKE 1 TABLET(1 MG) BY MOUTH DAILY (Patient not taking: Reported on 05/06/2024) 90 tablet 1   ibuprofen  (ADVIL ) 800 MG tablet Take 1 tablet (800 mg total) by mouth every 8 (eight) hours as needed for up to 42 doses. (Patient not taking: Reported on 05/06/2024) 42 tablet 0   IRON , FERROUS  SULFATE, PO Take 1 tablet by mouth 2 (two) times daily. 325mg  (Patient not taking: Reported on 05/06/2024)     traMADol  (ULTRAM ) 50 MG tablet Day 1: Take 1 tablet by mouth every 4 hours as needed. Day 2: Take 1 tablet by mouth every 6 hours as needed. Day 3: Take 1 tablet by mouth every 8 hours as needed.  Day 4: Take 1 tablet by mouth every 12 hours as needed. (Patient not taking: Reported on 05/06/2024) 15 tablet 0   WEGOVY 0.25 MG/0.5ML SOAJ Inject  0.25 mg into the skin once a week. Monday's (Patient not taking: Reported on 05/06/2024)     No current facility-administered medications on file prior to visit.    No Known Allergies    PE Today's Vitals   05/06/24 1426  BP: 112/64  Pulse: 75  SpO2: 98%  Weight: 165 lb 12.8 oz (75.2 kg)  Height: 5' 0.75" (1.543 m)   Body mass index is 31.59 kg/m.  Physical Exam Vitals reviewed.  Constitutional:      General: She is not in acute distress.    Appearance: Normal appearance.  HENT:     Head: Normocephalic and atraumatic.     Nose: Nose normal.  Eyes:     Extraocular Movements: Extraocular movements intact.     Conjunctiva/sclera: Conjunctivae normal.  Pulmonary:     Effort: Pulmonary effort is normal.  Abdominal:     General: There is no distension.     Palpations: Abdomen is soft.     Tenderness: There is no abdominal tenderness.     Comments: Incisions, c/d/I. Umbilical incision with mild erythema, however along abdominal fold. No drainage or tenderness.  Musculoskeletal:        General: Normal range of motion.     Cervical back: Normal range of motion.  Neurological:     General: No focal deficit present.     Mental Status: She is alert.  Psychiatric:        Mood and Affect: Mood normal.        Behavior: Behavior normal.      Assessment and Plan:        Postoperative examination  Normal postop exam.  Pathology reviewed with patient. Cleared to drrive. RTO in 4 weeks. All questions answered.   Romaine Closs, MD

## 2024-05-13 ENCOUNTER — Encounter: Payer: Self-pay | Admitting: Obstetrics and Gynecology

## 2024-06-03 ENCOUNTER — Ambulatory Visit (INDEPENDENT_AMBULATORY_CARE_PROVIDER_SITE_OTHER): Admitting: Obstetrics and Gynecology

## 2024-06-03 VITALS — BP 102/70 | HR 74 | Temp 97.4°F | Wt 172.0 lb

## 2024-06-03 DIAGNOSIS — Z09 Encounter for follow-up examination after completed treatment for conditions other than malignant neoplasm: Secondary | ICD-10-CM

## 2024-06-03 DIAGNOSIS — B9689 Other specified bacterial agents as the cause of diseases classified elsewhere: Secondary | ICD-10-CM

## 2024-06-03 DIAGNOSIS — N898 Other specified noninflammatory disorders of vagina: Secondary | ICD-10-CM

## 2024-06-03 DIAGNOSIS — N760 Acute vaginitis: Secondary | ICD-10-CM

## 2024-06-03 LAB — WET PREP FOR TRICH, YEAST, CLUE

## 2024-06-03 MED ORDER — METRONIDAZOLE 500 MG PO TABS: 500.0000 mg | ORAL_TABLET | Freq: Two times a day (BID) | ORAL | 0 refills | Status: AC

## 2024-06-03 NOTE — Progress Notes (Signed)
 48 y.o. G54P3003 female with hepatitis C (negative viral load 07/03/2023, completed treatment 2024, follows with Dr. Chauncey Cora at [facility]), AUB, here for postop exam: 6 wk s/p LOA, robotic assisted total laparoscopic hysterectomy, bilateral salpingectomy, cystoscopy, and abdominal wall repair. Single, relationship x8 years. Children 7-26yo. 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  She report everything feels amazing. Noticed spotting the last 2d. No longer taking any medications. Denies N/V, ab pain, VB, dysuria. Normal BM and voids.   04/22/24 pathology: A. UTERUS AND CERVIX, WITH BILATERAL FALLOPIAN TUBES, HYSTERECTOMY:  - Cervix: Low-grade squamous intraepithelial lesion  - Endometrium: Benign, no hyperplasia or malignancy  - Myometrium: Adenomyosis, leiomyoma  - Bilateral fallopian tubes: No significant pathologic changes   03/2023 PAP NIL  Birth control: none Sexually active: yes   GYN HISTORY: Hysteroscopy, 2024 Prior cystectomy, unsure which side  OB History  Gravida Para Term Preterm AB Living  3 3 3  0 0 3  SAB IAB Ectopic Multiple Live Births  0 0 0 0 3    # Outcome Date GA Lbr Len/2nd Weight Sex Type Anes PTL Lv  3 Term      Vag-Spont   LIV  2 Term      Vag-Spont   LIV  1 Term      Vag-Spont   LIV    Past Medical History:  Diagnosis Date   Abnormal uterine bleeding (AUB)    Anxiety    Chronic hepatitis C without hepatic coma (HCC) 11/2022   [facility]--- Lisbeth Rides NP;  dx 12/ 2023  Genotype !a,  started Epclusa treated 01/ 2024 completed 12 weeks;  Fibroscan 12-25-2022 and abdominal ultrasound 12-19-2022 results in care everywhere,  F0--F1,  test show negative for detection of HVCRNA   Depression    Hepatitis C    Iron  deficiency anemia due to chronic blood loss    [facility]gy--- [facility];   iron  infusions june and sept 2024   Metabolic dysfunction-associated steatotic liver disease (MASLD)    early hepatic fibroosis   Right ovarian cyst      Past Surgical History:  Procedure Laterality Date   CHOLECYSTECTOMY, LAPAROSCOPIC  1998   CYSTOSCOPY  04/22/2024   Procedure: CYSTOSCOPY;  Surgeon: Romaine Closs, MD;  [facility];  Service: Gynecology;;   HYSTERECTOMY, TOTAL, LAPAROSCOPIC, ROBOT-ASSISTED WITH SALPINGECTOMY Bilateral 04/22/2024   Procedure: HYSTERECTOMY, TOTAL, LAPAROSCOPIC, ROBOT-ASSISTED WITH SALPINGECTOMY;  Surgeon: Romaine Closs, MD;  [facility];  Service: Gynecology;  Laterality: Bilateral;   LAPAROSCOPIC GASTRIC SLEEVE RESECTION  2016   LAPAROSCOPIC OVARIAN CYSTECTOMY  2003   LAPAROSCOPIC TUBAL LIGATION Bilateral 07/2019   [facility] by dr b. Kenith Payer;   fulgeration   (per physician note in epic)   ROBOTIC ASSISTED LAPAROSCOPIC LYSIS OF ADHESION  04/22/2024   Procedure: LYSIS, ADHESIONS, ROBOT-ASSISTED, LAPAROSCOPIC/ ABDOMINAL WALL REPAIRED;  Surgeon: Romaine Closs, MD;  [facility];  Service: Gynecology;;    No current outpatient medications on file prior to visit.   No current facility-administered medications on file prior to visit.    No Known Allergies    PE Today's Vitals   06/03/24 1421  BP: 102/70  Pulse: 74  Temp: (!) 97.4 F (36.3 C)  TempSrc: Oral  SpO2: 99%  Weight: 172 lb (78 kg)   Body mass index is 32.77 kg/m.  Physical Exam Vitals reviewed. Exam conducted with a chaperone present.  Constitutional:      General: She is not in acute  distress.    Appearance: Normal appearance.  HENT:     Head: Normocephalic and atraumatic.     Nose: Nose normal.   Eyes:     Extraocular Movements: Extraocular movements intact.     Conjunctiva/sclera: Conjunctivae normal.   Pulmonary:     Effort: Pulmonary effort is normal.  Abdominal:     General: There is no distension.     Palpations: Abdomen is soft.     Tenderness: There is no abdominal tenderness.     Comments: Incisions, c/d/I.  Genitourinary:    General: Normal vulva.     Exam position: Lithotomy  position.     Vagina: Vaginal discharge and bleeding (dark brown,spotting (small amount)) present.     Uterus: Absent.      Adnexa: Right adnexa normal and left adnexa normal.     Comments: Cervix and uterus absent  Musculoskeletal:        General: Normal range of motion.     Cervical back: Normal range of motion.  Lymphadenopathy:     Lower Body: No right inguinal adenopathy. No left inguinal adenopathy.   Neurological:     General: No focal deficit present.     Mental Status: She is alert.   Psychiatric:        Mood and Affect: Mood normal.        Behavior: Behavior normal.      Assessment and Plan:        Postoperative examination Normal postop exam.  Pathology reviewed with patient. Recommend PAP with next annual exam. Cleared to RTQ.  All questions answered.   BV (bacterial vaginosis) Other orders -     metroNIDAZOLE ; Take 1 tablet (500 mg total) by mouth 2 (two) times daily for 7 days.  Dispense: 14 tablet; Refill: 0  Annual in 1 yr, f/u sooner if needed  Romaine Closs, MD

## 2024-06-03 NOTE — Addendum Note (Signed)
 Addended by: Romaine Closs on: 06/03/2024 04:57 PM   Modules accepted: Orders

## 2024-06-03 NOTE — Patient Instructions (Addendum)
 Continue pelvic rest for 4 weeks or longer if you are still spotting or cramping. This includes use of tampons, intercourse, douching, public bathing (including hot tubs, pools, the ocean).   Apply vaseline or moisturizer to your incisions daily.

## 2024-06-04 ENCOUNTER — Ambulatory Visit: Payer: Self-pay | Admitting: Obstetrics and Gynecology

## 2024-06-11 ENCOUNTER — Inpatient Hospital Stay

## 2024-06-11 ENCOUNTER — Inpatient Hospital Stay: Admitting: Hematology and Oncology

## 2024-07-09 ENCOUNTER — Other Ambulatory Visit: Payer: Self-pay | Admitting: Hematology and Oncology

## 2024-07-09 DIAGNOSIS — D500 Iron deficiency anemia secondary to blood loss (chronic): Secondary | ICD-10-CM

## 2024-07-10 ENCOUNTER — Inpatient Hospital Stay: Attending: Hematology and Oncology

## 2024-07-10 ENCOUNTER — Inpatient Hospital Stay: Admitting: Hematology and Oncology

## 2024-07-10 ENCOUNTER — Encounter: Payer: Self-pay | Admitting: Hematology and Oncology

## 2024-07-10 ENCOUNTER — Ambulatory Visit: Admitting: Hematology and Oncology

## 2024-07-10 ENCOUNTER — Inpatient Hospital Stay

## 2024-07-10 ENCOUNTER — Other Ambulatory Visit: Payer: Self-pay

## 2024-07-10 VITALS — BP 120/94 | HR 68 | Temp 99.1°F | Resp 14 | Ht 60.75 in | Wt 170.3 lb

## 2024-07-10 DIAGNOSIS — E538 Deficiency of other specified B group vitamins: Secondary | ICD-10-CM | POA: Diagnosis not present

## 2024-07-10 DIAGNOSIS — Z8619 Personal history of other infectious and parasitic diseases: Secondary | ICD-10-CM | POA: Diagnosis not present

## 2024-07-10 DIAGNOSIS — Z90712 Acquired absence of cervix with remaining uterus: Secondary | ICD-10-CM | POA: Insufficient documentation

## 2024-07-10 DIAGNOSIS — D500 Iron deficiency anemia secondary to blood loss (chronic): Secondary | ICD-10-CM | POA: Diagnosis not present

## 2024-07-10 DIAGNOSIS — D509 Iron deficiency anemia, unspecified: Secondary | ICD-10-CM | POA: Insufficient documentation

## 2024-07-10 DIAGNOSIS — R5383 Other fatigue: Secondary | ICD-10-CM | POA: Diagnosis not present

## 2024-07-10 DIAGNOSIS — F17210 Nicotine dependence, cigarettes, uncomplicated: Secondary | ICD-10-CM | POA: Insufficient documentation

## 2024-07-10 DIAGNOSIS — Z9884 Bariatric surgery status: Secondary | ICD-10-CM | POA: Diagnosis not present

## 2024-07-10 DIAGNOSIS — Z79899 Other long term (current) drug therapy: Secondary | ICD-10-CM | POA: Diagnosis not present

## 2024-07-10 LAB — CBC WITH DIFFERENTIAL (CANCER CENTER ONLY)
Abs Immature Granulocytes: 0.03 K/uL (ref 0.00–0.07)
Basophils Absolute: 0 K/uL (ref 0.0–0.1)
Basophils Relative: 0 %
Eosinophils Absolute: 0.3 K/uL (ref 0.0–0.5)
Eosinophils Relative: 3 %
HCT: 39.7 % (ref 36.0–46.0)
Hemoglobin: 12.3 g/dL (ref 12.0–15.0)
Immature Granulocytes: 0 %
Lymphocytes Relative: 31 %
Lymphs Abs: 2.8 K/uL (ref 0.7–4.0)
MCH: 27.3 pg (ref 26.0–34.0)
MCHC: 31 g/dL (ref 30.0–36.0)
MCV: 88.2 fL (ref 80.0–100.0)
Monocytes Absolute: 0.3 K/uL (ref 0.1–1.0)
Monocytes Relative: 3 %
Neutro Abs: 5.7 K/uL (ref 1.7–7.7)
Neutrophils Relative %: 63 %
Platelet Count: 264 K/uL (ref 150–400)
RBC: 4.5 MIL/uL (ref 3.87–5.11)
RDW: 13.6 % (ref 11.5–15.5)
WBC Count: 9.1 K/uL (ref 4.0–10.5)
nRBC: 0 % (ref 0.0–0.2)

## 2024-07-10 LAB — VITAMIN B12: Vitamin B-12: 271 pg/mL (ref 180–914)

## 2024-07-10 LAB — IRON AND TIBC
Iron: 44 ug/dL (ref 28–170)
Saturation Ratios: 12 % (ref 10.4–31.8)
TIBC: 382 ug/dL (ref 250–450)
UIBC: 338 ug/dL

## 2024-07-10 LAB — FERRITIN: Ferritin: 10 ng/mL — ABNORMAL LOW (ref 11–307)

## 2024-07-10 LAB — FOLATE: Folate: 7.8 ng/mL (ref >5.9)

## 2024-07-10 NOTE — Progress Notes (Unsigned)
 [facility]  [address] [place],  [place]  [address] [phone]  Clinic Day:  07/10/2024  Referring physician: Marelyn Quill, MD   HISTORY OF PRESENT ILLNESS:  The patient is a 48 y.o. female with iron  deficiency anemia, which is felt to be related to menorrhagia and potentially decreased absorption due to her gastric sleeve. She received IV iron  in June and again in September 2024.  She was also found to have folate deficiency, for which she is on folic acid  1000 mcg daily.  Evaluation did not reveal evidence of bleeding disorder contributing to her heavy menses.  She is here for repeat clinical assessment.  She reports persistent fatigue. She denies pica to ice.  She underwent hysterectomy in early May.  She is scheduled for screening colonoscopy in August. She is on Wegovy for weight loss.  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  PHYSICAL EXAM:  Blood pressure (!) 120/94, pulse 68, temperature 99.1 F (37.3 C), temperature source Oral, resp. rate 14, height 5' 0.75 (1.543 m), weight 170 lb 4.8 oz (77.2 kg), last menstrual period 04/13/2023, SpO2 100%. Wt Readings from Last 3 Encounters:  07/10/24 170 lb 4.8 oz (77.2 kg)  06/03/24 172 lb (78 kg)  05/06/24 165 lb 12.8 oz (75.2 kg)   Body mass index is 32.44 kg/m.  Performance status (ECOG): 1 - Symptomatic but completely ambulatory  Physical Exam Vitals and nursing note reviewed.  Constitutional:      General: She is not in acute distress.    Appearance: Normal appearance. She is obese. She is not ill-appearing.  HENT:     Head: Normocephalic and atraumatic.     Mouth/Throat:     Mouth: Mucous membranes are moist.     Pharynx: Oropharynx is clear. No oropharyngeal exudate or posterior oropharyngeal erythema.  Eyes:     General: No scleral icterus.    Extraocular Movements: Extraocular movements intact.     Conjunctiva/sclera:  Conjunctivae normal.     Pupils: Pupils are equal, round, and reactive to light.  Cardiovascular:     Rate and Rhythm: Normal rate and regular rhythm.     Heart sounds: Normal heart sounds. No murmur heard.    No friction rub. No gallop.  Pulmonary:     Effort: Pulmonary effort is normal.     Breath sounds: Normal breath sounds. No wheezing, rhonchi or rales.  Abdominal:     General: There is no distension.     Palpations: Abdomen is soft. There is no hepatomegaly, splenomegaly or mass.     Tenderness: There is no abdominal tenderness.  Musculoskeletal:        General: Normal range of motion.     Cervical back: Normal range of motion and neck supple. No tenderness.     Right lower leg: No edema.     Left lower leg: No edema.  Lymphadenopathy:     Cervical: No cervical adenopathy.     Upper Body:     Right upper body: No supraclavicular or axillary adenopathy.     Left upper body: No supraclavicular or axillary adenopathy.     Lower Body: No right inguinal adenopathy. No left inguinal adenopathy.  Skin:    General: Skin is warm and dry.     Coloration: Skin is not jaundiced.     Findings: No rash.  Neurological:     Mental Status: She is alert and oriented  to person, place, and time.     Cranial Nerves: No cranial nerve deficit.  Psychiatric:        Mood and Affect: Mood normal.        Behavior: Behavior normal.        Thought Content: Thought content normal.     LABS:      Latest Ref Rng & Units 07/10/2024    3:28 PM 04/22/2024   11:30 AM 04/20/2024   12:55 PM  CBC  WBC 4.0 - 10.5 K/uL 9.1  6.7  8.8   Hemoglobin 12.0 - 15.0 g/dL 87.6  89.1  88.5   Hematocrit 36.0 - 46.0 % 39.7  34.1  36.9   Platelets 150 - 400 K/uL 264  317  321       Latest Ref Rng & Units 04/20/2024   12:55 PM 03/11/2024    1:24 PM  CMP  Glucose 70 - 99 mg/dL 94  86   BUN 6 - 20 mg/dL 12  12   Creatinine 9.55 - 1.00 mg/dL 9.22  9.28   Sodium 864 - 145 mmol/L 142  140   Potassium 3.5 - 5.1 mmol/L  4.3  3.9   Chloride 98 - 111 mmol/L 107  105   CO2 22 - 32 mmol/L 26  24   Calcium 8.9 - 10.3 mg/dL 8.9  9.1   Total Protein 6.5 - 8.1 g/dL 7.3  7.4   Total Bilirubin 0.0 - 1.2 mg/dL 0.5  <9.7   Alkaline Phos 38 - 126 U/L 62  84   AST 15 - 41 U/L 16  17   ALT 0 - 44 U/L 9  <5     Lab Results  Component Value Date   TIBC 344 03/11/2024   TIBC 361 12/05/2023   TIBC 399 09/04/2023   FERRITIN 43 03/11/2024   FERRITIN 48 12/05/2023   FERRITIN 10 (L) 09/04/2023   IRONPCTSAT 20 03/11/2024   IRONPCTSAT 21 12/05/2023   IRONPCTSAT 9 (L) 09/04/2023   Lab Results  Component Value Date   LDH 115 05/23/2023       Component Value Date/Time   LDH 115 05/23/2023 1538    Review Flowsheet  More data exists      Latest Ref Rng & Units 09/04/2023 12/05/2023 03/11/2024  Oncology Labs  Ferritin 11 - 307 ng/mL 10  48  43   %SAT 10.4 - 31.8 % 9  21  20       STUDIES:  No results found.     ASSESSMENT & PLAN:   Assessment/Plan:  48 y.o. female with iron  deficiency anemia felt to be due to menorrhagia. She had hysterectomy in May. She is scheduled for colonoscopy in August. She also had folate deficiency. She is not taking folic acid  anymore.  Her hemoglobin remains normal.  Nutritional studies are pending from today. As she no longer has blood loss due to menorrhagia, I will release her back to Dr. Marelyn.  I am glad to see her back in the future as needed. The patient understands all the plans discussed today and is in agreement with them.  She knows to contact our office if she develops concerns prior to her next appointment.     Andrez DELENA Foy, PA-C   Physician Assistant [facility] [place] [phone]  Addendum:  Iron  studies are consistent with recurrent iron  deficiency so I will arrange for her to have Venofer  again in the upcoming days. I will plan to  see her back in 3 months for repeat clinical assessment

## 2024-07-16 ENCOUNTER — Encounter: Payer: Self-pay | Admitting: Hematology and Oncology

## 2024-07-17 ENCOUNTER — Telehealth: Payer: Self-pay | Admitting: Hematology and Oncology

## 2024-07-17 NOTE — Telephone Encounter (Signed)
 Patient has been scheduled. Aware of appt date and time.

## 2024-07-17 NOTE — Telephone Encounter (Signed)
-----   Message from Andrez DELENA Foy sent at 07/16/2024  3:13 PM EDT ----- Please schedule venofer  x 5 next available. Then, I would like to see her in 3 months with labs. Thanks ----- Message ----- From: Foy Andrez DELENA, PA-C Sent: 07/16/2024   3:15 PM EDT To: Damaris Figueroa; Karna Raspberry  Please schedule venofer  x 5 next available. Thanks

## 2024-07-31 LAB — HM COLONOSCOPY

## 2024-08-10 ENCOUNTER — Inpatient Hospital Stay: Attending: Hematology and Oncology

## 2024-08-10 ENCOUNTER — Encounter: Payer: Self-pay | Admitting: Hematology and Oncology

## 2024-08-10 VITALS — BP 100/60 | HR 67 | Temp 97.8°F | Resp 18

## 2024-08-10 DIAGNOSIS — D509 Iron deficiency anemia, unspecified: Secondary | ICD-10-CM | POA: Diagnosis present

## 2024-08-10 DIAGNOSIS — D500 Iron deficiency anemia secondary to blood loss (chronic): Secondary | ICD-10-CM

## 2024-08-10 MED ORDER — ACETAMINOPHEN 325 MG PO TABS
650.0000 mg | ORAL_TABLET | Freq: Once | ORAL | Status: AC
  Administered 2024-08-10: 650 mg via ORAL
  Filled 2024-08-10: qty 2

## 2024-08-10 MED ORDER — LORATADINE 10 MG PO TABS
10.0000 mg | ORAL_TABLET | Freq: Once | ORAL | Status: AC
  Administered 2024-08-10: 10 mg via ORAL
  Filled 2024-08-10: qty 1

## 2024-08-10 MED ORDER — IRON SUCROSE 20 MG/ML IV SOLN
200.0000 mg | Freq: Once | INTRAVENOUS | Status: AC
  Administered 2024-08-10: 200 mg via INTRAVENOUS
  Filled 2024-08-10: qty 10

## 2024-08-10 NOTE — Patient Instructions (Signed)

## 2024-08-12 ENCOUNTER — Inpatient Hospital Stay

## 2024-08-12 ENCOUNTER — Telehealth: Payer: Self-pay | Admitting: Pharmacy Technician

## 2024-08-12 VITALS — BP 99/74 | HR 71 | Temp 98.6°F | Resp 18

## 2024-08-12 DIAGNOSIS — D500 Iron deficiency anemia secondary to blood loss (chronic): Secondary | ICD-10-CM

## 2024-08-12 DIAGNOSIS — D509 Iron deficiency anemia, unspecified: Secondary | ICD-10-CM | POA: Diagnosis not present

## 2024-08-12 MED ORDER — LORATADINE 10 MG PO TABS
10.0000 mg | ORAL_TABLET | Freq: Once | ORAL | Status: AC
  Administered 2024-08-12: 10 mg via ORAL
  Filled 2024-08-12: qty 1

## 2024-08-12 MED ORDER — ACETAMINOPHEN 325 MG PO TABS: 650.0000 mg | ORAL_TABLET | Freq: Once | ORAL | Status: DC

## 2024-08-12 MED ORDER — IRON SUCROSE 20 MG/ML IV SOLN
200.0000 mg | Freq: Once | INTRAVENOUS | Status: AC
  Administered 2024-08-12: 200 mg via INTRAVENOUS
  Filled 2024-08-12: qty 10

## 2024-08-12 NOTE — Telephone Encounter (Signed)
 Spoke with patient regarding the [employer].  Currently patient is insured and does not receive any government assistance.  Patient did indicate that she is going tomorrow, 08/13/24 to apply for food stamps. Provided patient with my phone number to call when she finds out if she meets the criteria for food stamps.  Heidi [name] Patient Pharmacologist [facility]

## 2024-08-12 NOTE — Patient Instructions (Signed)

## 2024-08-14 ENCOUNTER — Inpatient Hospital Stay

## 2024-08-14 VITALS — BP 118/67 | HR 76 | Temp 98.1°F | Resp 18

## 2024-08-14 DIAGNOSIS — D500 Iron deficiency anemia secondary to blood loss (chronic): Secondary | ICD-10-CM

## 2024-08-14 DIAGNOSIS — D509 Iron deficiency anemia, unspecified: Secondary | ICD-10-CM | POA: Diagnosis not present

## 2024-08-14 MED ORDER — ACETAMINOPHEN 325 MG PO TABS
650.0000 mg | ORAL_TABLET | Freq: Once | ORAL | Status: AC
  Administered 2024-08-14: 650 mg via ORAL
  Filled 2024-08-14: qty 2

## 2024-08-14 MED ORDER — LORATADINE 10 MG PO TABS
10.0000 mg | ORAL_TABLET | Freq: Once | ORAL | Status: AC
  Administered 2024-08-14: 10 mg via ORAL
  Filled 2024-08-14: qty 1

## 2024-08-14 MED ORDER — IRON SUCROSE 20 MG/ML IV SOLN
200.0000 mg | Freq: Once | INTRAVENOUS | Status: AC
  Administered 2024-08-14: 200 mg via INTRAVENOUS
  Filled 2024-08-14: qty 10

## 2024-08-14 NOTE — Patient Instructions (Signed)

## 2024-08-18 ENCOUNTER — Encounter: Payer: Self-pay | Admitting: Specialist

## 2024-08-18 ENCOUNTER — Inpatient Hospital Stay: Attending: Hematology and Oncology

## 2024-08-18 ENCOUNTER — Ambulatory Visit

## 2024-08-18 VITALS — BP 130/89 | HR 64 | Temp 98.7°F | Resp 18

## 2024-08-18 DIAGNOSIS — D500 Iron deficiency anemia secondary to blood loss (chronic): Secondary | ICD-10-CM

## 2024-08-18 DIAGNOSIS — D509 Iron deficiency anemia, unspecified: Secondary | ICD-10-CM | POA: Insufficient documentation

## 2024-08-18 MED ORDER — ACETAMINOPHEN 325 MG PO TABS
650.0000 mg | ORAL_TABLET | Freq: Once | ORAL | Status: AC
  Administered 2024-08-18: 650 mg via ORAL
  Filled 2024-08-18: qty 2

## 2024-08-18 MED ORDER — IRON SUCROSE 20 MG/ML IV SOLN
200.0000 mg | Freq: Once | INTRAVENOUS | Status: AC
  Administered 2024-08-18: 200 mg via INTRAVENOUS
  Filled 2024-08-18: qty 10

## 2024-08-18 MED ORDER — LORATADINE 10 MG PO TABS
10.0000 mg | ORAL_TABLET | Freq: Once | ORAL | Status: AC
  Administered 2024-08-18: 10 mg via ORAL
  Filled 2024-08-18: qty 1

## 2024-08-18 NOTE — Patient Instructions (Signed)

## 2024-08-18 NOTE — Progress Notes (Signed)
 Pt declined 30 min wait time, d/c stable

## 2024-08-20 ENCOUNTER — Inpatient Hospital Stay

## 2024-08-20 ENCOUNTER — Encounter: Payer: Self-pay | Admitting: Hematology and Oncology

## 2024-08-20 ENCOUNTER — Ambulatory Visit

## 2024-08-20 VITALS — BP 112/82 | HR 89 | Temp 98.3°F | Resp 18

## 2024-08-20 DIAGNOSIS — D500 Iron deficiency anemia secondary to blood loss (chronic): Secondary | ICD-10-CM

## 2024-08-20 DIAGNOSIS — D509 Iron deficiency anemia, unspecified: Secondary | ICD-10-CM | POA: Diagnosis not present

## 2024-08-20 MED ORDER — LORATADINE 10 MG PO TABS
10.0000 mg | ORAL_TABLET | Freq: Once | ORAL | Status: AC
  Administered 2024-08-20: 10 mg via ORAL
  Filled 2024-08-20: qty 1

## 2024-08-20 MED ORDER — SODIUM CHLORIDE 0.9 % IV SOLN: INTRAVENOUS | Status: DC

## 2024-08-20 MED ORDER — IRON SUCROSE 20 MG/ML IV SOLN
200.0000 mg | Freq: Once | INTRAVENOUS | Status: AC
  Administered 2024-08-20: 200 mg via INTRAVENOUS
  Filled 2024-08-20: qty 10

## 2024-08-20 MED ORDER — ACETAMINOPHEN 325 MG PO TABS
650.0000 mg | ORAL_TABLET | Freq: Once | ORAL | Status: AC
  Administered 2024-08-20: 650 mg via ORAL
  Filled 2024-08-20: qty 2

## 2024-08-20 NOTE — Patient Instructions (Signed)

## 2024-08-20 NOTE — Progress Notes (Signed)
 Pt declined 30 min wait time, pt d/c stable

## 2024-09-14 ENCOUNTER — Encounter: Payer: Self-pay | Admitting: Hematology and Oncology

## 2024-09-25 ENCOUNTER — Telehealth: Payer: Self-pay | Admitting: Pharmacy Technician

## 2024-09-25 NOTE — Telephone Encounter (Signed)
 Received referral to screen patient for [employer].  Patient does not have a cancer diagnosis.  Therefore, does not meet the eligibility criteria for the grant.  I contacted patient to make aware.    Dickey DOROTHA Fritter Patient Pharmacologist [facility]

## 2024-10-14 ENCOUNTER — Other Ambulatory Visit: Payer: Self-pay | Admitting: Hematology and Oncology

## 2024-10-14 DIAGNOSIS — D500 Iron deficiency anemia secondary to blood loss (chronic): Secondary | ICD-10-CM

## 2024-10-20 ENCOUNTER — Inpatient Hospital Stay

## 2024-10-20 ENCOUNTER — Inpatient Hospital Stay: Attending: Hematology and Oncology | Admitting: Hematology and Oncology

## 2024-10-20 ENCOUNTER — Other Ambulatory Visit: Payer: Self-pay

## 2024-10-20 ENCOUNTER — Encounter: Payer: Self-pay | Admitting: Hematology and Oncology

## 2024-10-20 VITALS — BP 119/79 | HR 60 | Temp 98.2°F | Resp 14 | Ht 60.75 in | Wt 164.6 lb

## 2024-10-20 DIAGNOSIS — N920 Excessive and frequent menstruation with regular cycle: Secondary | ICD-10-CM | POA: Insufficient documentation

## 2024-10-20 DIAGNOSIS — D509 Iron deficiency anemia, unspecified: Secondary | ICD-10-CM | POA: Diagnosis present

## 2024-10-20 DIAGNOSIS — D508 Other iron deficiency anemias: Secondary | ICD-10-CM

## 2024-10-20 DIAGNOSIS — D500 Iron deficiency anemia secondary to blood loss (chronic): Secondary | ICD-10-CM

## 2024-10-20 LAB — CMP (CANCER CENTER ONLY)
ALT: 6 U/L (ref 0–44)
AST: 16 U/L (ref 15–41)
Albumin: 4.2 g/dL (ref 3.5–5.0)
Alkaline Phosphatase: 90 U/L (ref 38–126)
Anion gap: 9 (ref 5–15)
BUN: 9 mg/dL (ref 6–20)
CO2: 27 mmol/L (ref 22–32)
Calcium: 9.1 mg/dL (ref 8.9–10.3)
Chloride: 103 mmol/L (ref 98–111)
Creatinine: 0.66 mg/dL (ref 0.44–1.00)
GFR, Estimated: >60 mL/min (ref >60)
Glucose, Bld: 89 mg/dL (ref 70–99)
Potassium: 3.8 mmol/L (ref 3.5–5.1)
Sodium: 139 mmol/L (ref 135–145)
Total Bilirubin: 0.3 mg/dL (ref 0.0–1.2)
Total Protein: 7.3 g/dL (ref 6.5–8.1)

## 2024-10-20 LAB — IRON AND TIBC
Iron: 65 ug/dL (ref 28–170)
Saturation Ratios: 24 % (ref 10.4–31.8)
TIBC: 274 ug/dL (ref 250–450)
UIBC: 210 ug/dL

## 2024-10-20 LAB — CBC WITH DIFFERENTIAL (CANCER CENTER ONLY)
Abs Immature Granulocytes: 0.02 K/uL (ref 0.00–0.07)
Basophils Absolute: 0 K/uL (ref 0.0–0.1)
Basophils Relative: 1 %
Eosinophils Absolute: 0.3 K/uL (ref 0.0–0.5)
Eosinophils Relative: 3 %
HCT: 39.4 % (ref 36.0–46.0)
Hemoglobin: 12.6 g/dL (ref 12.0–15.0)
Immature Granulocytes: 0 %
Lymphocytes Relative: 37 %
Lymphs Abs: 2.9 K/uL (ref 0.7–4.0)
MCH: 29.2 pg (ref 26.0–34.0)
MCHC: 32 g/dL (ref 30.0–36.0)
MCV: 91.2 fL (ref 80.0–100.0)
Monocytes Absolute: 0.3 K/uL (ref 0.1–1.0)
Monocytes Relative: 4 %
Neutro Abs: 4.1 K/uL (ref 1.7–7.7)
Neutrophils Relative %: 55 %
Platelet Count: 285 K/uL (ref 150–400)
RBC: 4.32 MIL/uL (ref 3.87–5.11)
RDW: 15.1 % (ref 11.5–15.5)
WBC Count: 7.7 K/uL (ref 4.0–10.5)
nRBC: 0 % (ref 0.0–0.2)

## 2024-10-20 LAB — VITAMIN B12: Vitamin B-12: 352 pg/mL (ref 180–914)

## 2024-10-20 LAB — FERRITIN: Ferritin: 290 ng/mL (ref 11–307)

## 2024-10-20 LAB — FOLATE: Folate: 4.3 ng/mL — ABNORMAL LOW (ref >5.9)

## 2024-10-20 NOTE — Progress Notes (Cosign Needed)
 [facility]  [address] [place],  [place]  [address] [phone]  Clinic Day:  10/20/2024  Referring physician: Marelyn Quill, MD   HISTORY OF PRESENT ILLNESS:  The patient is a 48 y.o. 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  She has intermittently received IV iron , most recently in August and September.  She was also found to have folate deficiency, she was placed on folic acid  1000 mcg daily but discontinued this on her own. She underwent hysterectomy in May.  EGD and colonoscopy in August did not reveal any obvious source of blood loss.  Small intestine biopsy was normal.  She is here for repeat clinical assessment.  She states she has been feeling better since receiving IV iron  in August and September.  She denies any overt form of blood loss.  She is not taking any vitamin supplements due to nausea and vomiting from many different formulas, including [employer].  PHYSICAL EXAM:  Blood pressure 119/79, pulse 60, temperature 98.2 F (36.8 C), temperature source Oral, resp. rate 14, height 5' 0.75 (1.543 m), weight 164 lb 9.6 oz (74.7 kg), last menstrual period 04/13/2023, SpO2 100%. Wt Readings from Last 3 Encounters:  10/20/24 164 lb 9.6 oz (74.7 kg)  07/10/24 170 lb 4.8 oz (77.2 kg)  06/03/24 172 lb (78 kg)   Body mass index is 31.36 kg/m.  Performance status (ECOG): 1 - Symptomatic but completely ambulatory  Physical Exam Vitals and nursing note reviewed.  Constitutional:      General: She is not in acute distress.    Appearance: Normal appearance. She is not ill-appearing.  HENT:     Head: Normocephalic and atraumatic.     Mouth/Throat:     Mouth: Mucous membranes are moist.     Pharynx: Oropharynx is clear. No oropharyngeal exudate or posterior oropharyngeal erythema.  Eyes:     General: No scleral icterus.    Extraocular Movements: Extraocular  movements intact.     Conjunctiva/sclera: Conjunctivae normal.     Pupils: Pupils are equal, round, and reactive to light.  Cardiovascular:     Rate and Rhythm: Normal rate and regular rhythm.     Heart sounds: Normal heart sounds. No murmur heard.    No friction rub. No gallop.  Pulmonary:     Effort: Pulmonary effort is normal.     Breath sounds: Normal breath sounds. No wheezing, rhonchi or rales.  Abdominal:     General: There is no distension.     Palpations: Abdomen is soft. There is no hepatomegaly, splenomegaly or mass.     Tenderness: There is no abdominal tenderness.  Musculoskeletal:        General: Normal range of motion.     Cervical back: Normal range of motion and neck supple. No tenderness.     Right lower leg: No edema.     Left lower leg: No edema.  Lymphadenopathy:     Cervical: No cervical adenopathy.     Upper Body:     Right upper body: No supraclavicular or axillary adenopathy.     Left upper body: No supraclavicular or axillary adenopathy.     Lower Body: No right inguinal adenopathy. No left inguinal adenopathy.  Skin:    General: Skin is warm and dry.     Coloration: Skin is not jaundiced.     Findings: No rash.  Neurological:     Mental  Status: She is alert and oriented to person, place, and time.     Cranial Nerves: No cranial nerve deficit.  Psychiatric:        Mood and Affect: Mood normal.        Behavior: Behavior normal.        Thought Content: Thought content normal.     LABS:      Latest Ref Rng & Units 10/20/2024    1:00 PM 07/10/2024    3:28 PM 04/22/2024   11:30 AM  CBC  WBC 4.0 - 10.5 K/uL 7.7  9.1  6.7   Hemoglobin 12.0 - 15.0 g/dL 87.3  87.6  89.1   Hematocrit 36.0 - 46.0 % 39.4  39.7  34.1   Platelets 150 - 400 K/uL 285  264  317       Latest Ref Rng & Units 10/20/2024    1:00 PM 04/20/2024   12:55 PM 03/11/2024    1:24 PM  CMP  Glucose 70 - 99 mg/dL 89  94  86   BUN 6 - 20 mg/dL 9  12  12    Creatinine 0.44 - 1.00 mg/dL 9.33   9.22  9.28   Sodium 135 - 145 mmol/L 139  142  140   Potassium 3.5 - 5.1 mmol/L 3.8  4.3  3.9   Chloride 98 - 111 mmol/L 103  107  105   CO2 22 - 32 mmol/L 27  26  24    Calcium 8.9 - 10.3 mg/dL 9.1  8.9  9.1   Total Protein 6.5 - 8.1 g/dL 7.3  7.3  7.4   Total Bilirubin 0.0 - 1.2 mg/dL 0.3  0.5  <9.7   Alkaline Phos 38 - 126 U/L 90  62  84   AST 15 - 41 U/L 16  16  17    ALT 0 - 44 U/L 6  9  <5     Lab Results  Component Value Date   TIBC 382 07/10/2024   TIBC 344 03/11/2024   TIBC 361 12/05/2023   FERRITIN 10 (L) 07/10/2024   FERRITIN 43 03/11/2024   FERRITIN 48 12/05/2023   IRONPCTSAT 12 07/10/2024   IRONPCTSAT 20 03/11/2024   IRONPCTSAT 21 12/05/2023   Lab Results  Component Value Date   LDH 115 05/23/2023       Component Value Date/Time   LDH 115 05/23/2023 1538    Review Flowsheet  More data exists      Latest Ref Rng & Units 12/05/2023 03/11/2024 07/10/2024  Oncology Labs  Ferritin 11 - 307 ng/mL 48  43  10   %SAT 10.4 - 31.8 % 21  20  12       STUDIES:  No results found.     ASSESSMENT & PLAN:   Assessment/Plan:  48 y.o. female with iron  deficiency anemia felt to be due to menorrhagia. She had recurrent iron  deficiency at her appointment in August so received IV iron  again.  Her hemoglobin remains normal.  Nutritional studies are pending from today. I will plan to see her back in 3 months for repeat clinical assessment. The patient understands all the plans discussed today and is in agreement with them.  She knows to contact our office if she develops concerns prior to her next appointment.     Andrez DELENA Foy, PA-C   Physician Assistant [facility] [place] [phone]

## 2024-11-03 ENCOUNTER — Telehealth: Payer: Self-pay

## 2024-11-03 NOTE — Telephone Encounter (Signed)
 Phone call to follow up with patient to confirm if she had looked at her my chart message. She did let me know she had not. I have advised her to start Folic Acid  as discussed below.

## 2024-11-03 NOTE — Telephone Encounter (Signed)
-----   Message from Andrez DELENA Foy sent at 10/21/2024  8:03 AM EST ----- Please let her know her folic acid  is low. Have her start OTC folic acid  400 mcg daily. Thanks

## 2025-01-13 ENCOUNTER — Encounter: Payer: Self-pay | Admitting: Hematology and Oncology

## 2025-01-20 ENCOUNTER — Inpatient Hospital Stay: Payer: Self-pay

## 2025-01-20 ENCOUNTER — Inpatient Hospital Stay: Payer: Self-pay | Admitting: Hematology and Oncology

## 2025-01-26 ENCOUNTER — Inpatient Hospital Stay: Payer: Self-pay | Admitting: Hematology and Oncology

## 2025-01-26 ENCOUNTER — Inpatient Hospital Stay: Payer: Self-pay | Attending: Hematology and Oncology
